# Patient Record
Sex: Male | Born: 1975 | Race: White | Hispanic: No | Marital: Single | Smoking: Former smoker
Health system: Southern US, Community
[De-identification: ages and names within clinical notes are randomized; demographics above are authoritative.]

## PROBLEM LIST (undated history)

## (undated) DIAGNOSIS — J189 Pneumonia, unspecified organism: Secondary | ICD-10-CM

## (undated) DIAGNOSIS — I639 Cerebral infarction, unspecified: Secondary | ICD-10-CM

---

## 2012-02-23 ENCOUNTER — Emergency Department (HOSPITAL_COMMUNITY): Payer: Self-pay

## 2012-02-23 ENCOUNTER — Emergency Department (HOSPITAL_COMMUNITY)
Admission: EM | Admit: 2012-02-23 | Discharge: 2012-02-23 | Disposition: A | Discharge status: Home / Self Care | Payer: Self-pay | Attending: Emergency Medicine | Admitting: Emergency Medicine

## 2012-02-23 ENCOUNTER — Encounter (HOSPITAL_COMMUNITY): Payer: Self-pay | Admitting: Emergency Medicine

## 2012-02-23 DIAGNOSIS — Z87891 Personal history of nicotine dependence: Secondary | ICD-10-CM | POA: Insufficient documentation

## 2012-02-23 DIAGNOSIS — Z8673 Personal history of transient ischemic attack (TIA), and cerebral infarction without residual deficits: Secondary | ICD-10-CM | POA: Insufficient documentation

## 2012-02-23 DIAGNOSIS — J209 Acute bronchitis, unspecified: Secondary | ICD-10-CM

## 2012-02-23 DIAGNOSIS — J40 Bronchitis, not specified as acute or chronic: Secondary | ICD-10-CM | POA: Insufficient documentation

## 2012-02-23 HISTORY — DX: Cerebral infarction, unspecified: I63.9

## 2012-02-23 HISTORY — DX: Pneumonia, unspecified organism: J18.9

## 2012-02-23 MED ORDER — PREDNISONE 20 MG PO TABS
60.0000 mg | ORAL_TABLET | Freq: Once | ORAL | Status: AC
  Administered 2012-02-23: 60 mg via ORAL
  Filled 2012-02-23: qty 3

## 2012-02-23 MED ORDER — ALBUTEROL SULFATE (5 MG/ML) 0.5% IN NEBU
5.0000 mg | INHALATION_SOLUTION | Freq: Once | RESPIRATORY_TRACT | Status: AC
  Administered 2012-02-23: 5 mg via RESPIRATORY_TRACT
  Filled 2012-02-23: qty 0.5

## 2012-02-23 MED ORDER — ALBUTEROL SULFATE (5 MG/ML) 0.5% IN NEBU
2.5000 mg | INHALATION_SOLUTION | Freq: Once | RESPIRATORY_TRACT | Status: AC
  Administered 2012-02-23: 2.5 mg via RESPIRATORY_TRACT
  Filled 2012-02-23: qty 0.5

## 2012-02-23 MED ORDER — PREDNISONE 50 MG PO TABS: 50.0000 mg | ORAL_TABLET | Freq: Every day | ORAL | Status: AC

## 2012-02-23 MED ORDER — ALBUTEROL SULFATE HFA 108 (90 BASE) MCG/ACT IN AERS: 1.0000 | INHALATION_SPRAY | Freq: Four times a day (QID) | RESPIRATORY_TRACT | Status: DC | PRN

## 2012-02-23 MED ORDER — IPRATROPIUM BROMIDE 0.02 % IN SOLN
0.5000 mg | Freq: Once | RESPIRATORY_TRACT | Status: AC
  Administered 2012-02-23: 0.5 mg via RESPIRATORY_TRACT
  Filled 2012-02-23: qty 2.5

## 2012-02-23 MED ORDER — ALBUTEROL SULFATE HFA 108 (90 BASE) MCG/ACT IN AERS
2.0000 | INHALATION_SPRAY | Freq: Four times a day (QID) | RESPIRATORY_TRACT | Status: DC | PRN
  Administered 2012-02-23: 2 via RESPIRATORY_TRACT
  Filled 2012-02-23: qty 6.7

## 2012-02-23 NOTE — Progress Notes (Signed)
Pt listed as self pay with no insurance coverage Pt confirms he is [place] resident.  CM and [facility] coordinator spoke with him Pt refused offered, looking for insurance options through employer -[facility] Tuesday

## 2012-02-23 NOTE — Discharge Instructions (Signed)
Acute Bronchitis You have acute bronchitis. This means you have a chest cold. The airways in your lungs are red and sore (inflamed). Acute means it is sudden onset.  CAUSES Bronchitis is most often caused by the same virus that causes a cold. SYMPTOMS   Body aches.   Chest congestion.   Chills.   Cough.   Fever.   Shortness of breath.   Sore throat.  TREATMENT  Acute bronchitis is usually treated with rest, fluids, and medicines for relief of fever or cough. Most symptoms should go away after a few days or a week. Increased fluids may help thin your secretions and will prevent dehydration. Your caregiver may give you an inhaler to improve your symptoms. The inhaler reduces shortness of breath and helps control cough. You can take over-the-counter pain relievers or cough medicine to decrease coughing, pain, or fever. A cool-air vaporizer may help thin bronchial secretions and make it easier to clear your chest. Antibiotics are usually not needed but can be prescribed if you smoke, are seriously ill, have chronic lung problems, are elderly, or you are at higher risk for developing complications.Allergies and asthma can make bronchitis worse. Repeated episodes of bronchitis may cause longstanding lung problems. Avoid smoking and secondhand smoke.Exposure to cigarette smoke or irritating chemicals will make bronchitis worse. If you are a cigarette smoker, consider using nicotine gum or skin patches to help control withdrawal symptoms. Quitting smoking will help your lungs heal faster. Recovery from bronchitis is often slow, but you should start feeling better after 2 to 3 days. Cough from bronchitis frequently lasts for 3 to 4 weeks. To prevent another bout of acute bronchitis:  Quit smoking.   Wash your hands frequently to get rid of viruses or use a hand sanitizer.   Avoid other people with cold or virus symptoms.   Try not to touch your hands to your mouth, nose, or eyes.  SEEK  IMMEDIATE MEDICAL CARE IF:  You develop increased fever, chills, or chest pain.   You have severe shortness of breath or bloody sputum.   You develop dehydration, fainting, repeated vomiting, or a severe headache.   You have no improvement after 1 week of treatment or you get worse.  MAKE SURE YOU:   Understand these instructions.   Will watch your condition.   Will get help right away if you are not doing well or get worse.  Document Released: 10/26/2004 Document Revised: 09/07/2011 Document Reviewed: 01/11/2011 [facility] Patient Information 2012 [place], [place]. RESOURCE GUIDE  Dental Problems  Patients with Medicaid: [facility]                     [phone] [address].                                           Phone:  [phone]                                                  If unable to pay or uninsured, contact:  [facility] or [facility]. to become qualified for the adult dental clinic.  Chronic Pain Problems Contact [facility]  [phone] Patients need to be referred by  their primary care doctor.  Insufficient Money for Medicine Contact [employer]:  call "211" or [facility] [phone].  No Primary Care Doctor Call [facility]  [phone] Other agencies that provide inexpensive medical care    [facility]  [phone]    [facility]  [phone]    [facility]  [phone]    [facility]  [phone]    [facility]  [phone]    [facility]  [phone]  Substance Abuse Resources [facility]  548-078-4559 [facility] [phone] [facility] [phone] Floydene Flock 4017729011 Residential & Outpatient Substance Abuse Program  336-541-4935  Psychological Services [facility]  [phone] [facility]  [phone] [facility]   [phone] (emergency services  973-267-6817)  Abuse/Neglect [facility] Child Abuse Hotline (534)102-8743 [facility] Child Abuse Hotline 207-595-8753 (After Hours)  Emergency Shelter [facility] 321-080-4900  Maternity Homes Room at the [place] [facility] 937-135-9253 [facility] (531) [phone]  MRSA Hotline #:   337-795-3170    [facility] Resources  [facility]  United [employer]                           [facility] [address] [place]                     [address]         [address]  Blondell Reveal Phone:  [phone]                                  Phone:  [phone]                   Phone:  [phone]  [facility] Phone:  ([phone])[phone]  [facility] Child Abuse Hotline 7190088652 (626)673-0900 (After Hours)Bronchospasm A bronchospasm is when the tubes that carry air in and out of your lungs (bronchioles) become smaller. It is hard to breathe when this happens. A bronchospasm can be caused by:  Asthma.   Allergies.   Lung infection.  HOME CARE   Do not  smoke. Avoid places that have secondhand smoke.   Dust your house often. Have your air ducts cleaned once or twice a year.   Find out what allergies may cause your bronchospasms.   Use your inhaler properly if you have one. Know when to use it.   Eat healthy foods and drink plenty of water.   Only take medicine as told by your doctor.  GET HELP RIGHT AWAY IF:  You feel you cannot breathe or catch your breath.   You cannot stop coughing.   Your treatment is not helping you breathe better.  MAKE SURE YOU:   Understand these instructions.   Will watch your condition.   Will get  help right away if you are not doing well or get worse.  Document Released: 07/16/2009 Document Revised: 09/07/2011 Document Reviewed:  07/16/2009 [facility] Patient Information 2012 [place], [place].

## 2012-02-23 NOTE — ED Provider Notes (Signed)
History    CSN: [id number] Arrival date & time 02/23/12  4098 First MD Initiated Contact with Patient 02/23/12 [phone]    Chief Complaint  Patient presents with  . Shortness of Breath    shortness of breath, waking from sleep    Patient is a 36 y.o. male presenting with shortness of breath. The history is provided by the patient.  Shortness of Breath  The current episode started more than 2 weeks ago. The onset was gradual. The problem has been gradually worsening. The problem is moderate. The symptoms are relieved by nothing. The symptoms are aggravated by activity. Associated symptoms include cough, shortness of breath and wheezing. Pertinent negatives include no chest pain, no chest pressure, no rhinorrhea and no sore throat. He has had no prior steroid use. His past medical history does not include asthma. There were no sick contacts.  Pt states he has history of pna in the past.  Pt denies history of asthma but he still smokes. Pt was given an albuterol treatment prior to my evaluation and has noticed that he is feeling a lot better.  Past Medical History  Diagnosis Date  . Stroke   . PNA (pneumonia)     pneumonia x1  . Stroke, embolic     loss of vision l/eye 3 years ago   patient reports history of drug abuse in the past  History reviewed. No pertinent past surgical history.  Family History  Problem Relation Age of Onset  . Hypertension Father     History  Substance Use Topics  . Smoking status: Current Everyday Smoker    Types: Cigarettes  . Smokeless tobacco: Not on file  . Alcohol Use: Yes      Review of Systems  HENT: Negative for sore throat and rhinorrhea.   Respiratory: Positive for cough, shortness of breath and wheezing.   Cardiovascular: Negative for chest pain.  All other systems reviewed and are negative.    Allergies  Review of patient's allergies indicates no known allergies.  Home Medications   Current Outpatient Rx  Name Route Sig Dispense  Refill  . DIPHENHYDRAMINE HCL 25 MG PO CAPS Oral Take 25 mg by mouth every 6 (six) hours as needed.      BP 151/97  Pulse 106  Temp(Src) 98.6 F (37 C) (Oral)  Resp 16  SpO2 96%  Physical Exam  Nursing note and vitals reviewed. Constitutional: He appears well-developed and well-nourished. No distress.  HENT:  Head: Normocephalic and atraumatic.  Right Ear: External ear normal.  Left Ear: External ear normal.  Eyes: Conjunctivae are normal. Right eye exhibits no discharge. Left eye exhibits no discharge. No scleral icterus.  Neck: Neck supple. No tracheal deviation present.  Cardiovascular: Normal rate, regular rhythm and intact distal pulses.   Pulmonary/Chest: Effort normal. No accessory muscle usage or stridor. No respiratory distress. He has no decreased breath sounds. He has wheezes. He has no rhonchi. He has no rales.  Abdominal: Soft. Bowel sounds are normal. He exhibits no distension. There is no tenderness. There is no rebound and no guarding.  Musculoskeletal: He exhibits no edema and no tenderness.  Neurological: He is alert. He has normal strength. No sensory deficit. Cranial nerve deficit:  no gross defecits noted. He exhibits normal muscle tone. He displays no seizure activity. Coordination normal.  Skin: Skin is warm and dry. No rash noted.  Psychiatric: He has a normal mood and affect.    ED Course  Procedures (including critical care time)  Labs Reviewed - No data to display Dg Chest 2 View  02/23/2012  *RADIOLOGY REPORT*  Clinical Data: Shortness of breath and chest pain.  CHEST - 2 VIEW  Comparison: None.  Findings: Two views of the chest were obtained.  There are slightly prominent lung markings but no focal disease.  No evidence for edema or airspace disease. Heart and mediastinum are within normal limits.  The trachea is midline.  Bony structures are intact.  IMPRESSION: No acute chest findings.  Original Report Authenticated By: Richarda Overlie, M.D.    1.  Bronchitis with bronchospasm    MDM  The patient improved with treatment in emergency department. I counseled the patient on smoking cessation. The patient was given albuterol Atrovent treatments here in the emergency department. He had good response with resolution of his wheezing. He was discharged home with a prescription for albuterol inhalers and steroids.        Celene Kras, MD 02/23/12 [phone]

## 2012-02-23 NOTE — ED Notes (Signed)
Pt reports recurrent shortness of breath and wheezing, intermittent x 2 weeks. Sx decreased post benadryl this am. Bilat. Breath sounds anterior wheezing, Decreased posterior is bases

## 2014-11-13 ENCOUNTER — Emergency Department (HOSPITAL_COMMUNITY): Payer: Self-pay

## 2014-11-13 ENCOUNTER — Encounter (HOSPITAL_COMMUNITY): Payer: Self-pay

## 2014-11-13 ENCOUNTER — Emergency Department (HOSPITAL_COMMUNITY)
Admission: EM | Admit: 2014-11-13 | Discharge: 2014-11-13 | Disposition: A | Discharge status: Home / Self Care | Payer: Self-pay | Attending: Emergency Medicine | Admitting: Emergency Medicine

## 2014-11-13 DIAGNOSIS — Z8701 Personal history of pneumonia (recurrent): Secondary | ICD-10-CM | POA: Insufficient documentation

## 2014-11-13 DIAGNOSIS — Z8673 Personal history of transient ischemic attack (TIA), and cerebral infarction without residual deficits: Secondary | ICD-10-CM | POA: Insufficient documentation

## 2014-11-13 DIAGNOSIS — Z7982 Long term (current) use of aspirin: Secondary | ICD-10-CM | POA: Insufficient documentation

## 2014-11-13 DIAGNOSIS — Z720 Tobacco use: Secondary | ICD-10-CM | POA: Insufficient documentation

## 2014-11-13 DIAGNOSIS — J40 Bronchitis, not specified as acute or chronic: Secondary | ICD-10-CM | POA: Insufficient documentation

## 2014-11-13 DIAGNOSIS — Z791 Long term (current) use of non-steroidal anti-inflammatories (NSAID): Secondary | ICD-10-CM | POA: Insufficient documentation

## 2014-11-13 LAB — CBC
HCT: 44.7 % (ref 39.0–52.0)
HEMOGLOBIN: 15.2 g/dL (ref 13.0–17.0)
MCH: 32.3 pg (ref 26.0–34.0)
MCHC: 34 g/dL (ref 30.0–36.0)
MCV: 95.1 fL (ref 78.0–100.0)
PLATELETS: 245 10*3/uL (ref 150–400)
RBC: 4.7 MIL/uL (ref 4.22–5.81)
RDW: 12.7 % (ref 11.5–15.5)
WBC: 13.3 10*3/uL — AB (ref 4.0–10.5)

## 2014-11-13 MED ORDER — PREDNISONE 20 MG PO TABS
60.0000 mg | ORAL_TABLET | Freq: Once | ORAL | Status: AC
Start: 2014-11-13 — End: 2014-11-13
  Administered 2014-11-13: 60 mg via ORAL
  Filled 2014-11-13: qty 3

## 2014-11-13 MED ORDER — PREDNISONE 50 MG PO TABS: 50.0000 mg | ORAL_TABLET | Freq: Every day | ORAL | Status: DC

## 2014-11-13 MED ORDER — IPRATROPIUM BROMIDE 0.02 % IN SOLN
0.5000 mg | Freq: Once | RESPIRATORY_TRACT | Status: AC
Start: 2014-11-13 — End: 2014-11-13
  Administered 2014-11-13: 0.5 mg via RESPIRATORY_TRACT
  Filled 2014-11-13: qty 2.5

## 2014-11-13 MED ORDER — SODIUM CHLORIDE 0.9 % IV BOLUS (SEPSIS)
1000.0000 mL | Freq: Once | INTRAVENOUS | Status: AC
  Administered 2014-11-13: 1000 mL via INTRAVENOUS

## 2014-11-13 MED ORDER — ALBUTEROL SULFATE (2.5 MG/3ML) 0.083% IN NEBU
5.0000 mg | INHALATION_SOLUTION | Freq: Once | RESPIRATORY_TRACT | Status: AC
  Administered 2014-11-13: 5 mg via RESPIRATORY_TRACT
  Filled 2014-11-13: qty 6

## 2014-11-13 MED ORDER — GUAIFENESIN ER 1200 MG PO TB12: 1.0000 | ORAL_TABLET | Freq: Two times a day (BID) | ORAL | Status: DC

## 2014-11-13 MED ORDER — AEROCHAMBER PLUS FLO-VU MEDIUM MISC
1.0000 | Freq: Once | Status: AC
  Administered 2014-11-13: 1
  Filled 2014-11-13: qty 1

## 2014-11-13 MED ORDER — ACETAMINOPHEN-CODEINE 120-12 MG/5ML PO SOLN: 10.0000 mL | ORAL | Status: DC | PRN

## 2014-11-13 MED ORDER — ALBUTEROL (5 MG/ML) CONTINUOUS INHALATION SOLN
10.0000 mg/h | INHALATION_SOLUTION | RESPIRATORY_TRACT | Status: AC
  Administered 2014-11-13: 10 mg/h via RESPIRATORY_TRACT
  Filled 2014-11-13: qty 20

## 2014-11-13 MED ORDER — ALBUTEROL SULFATE HFA 108 (90 BASE) MCG/ACT IN AERS
2.0000 | INHALATION_SPRAY | RESPIRATORY_TRACT | Status: DC | PRN
  Administered 2014-11-13: 2 via RESPIRATORY_TRACT
  Filled 2014-11-13: qty 6.7

## 2014-11-13 NOTE — Discharge Instructions (Signed)
Return here as needed.  Follow-up with the clinic provided.  Increase your fluid intake and rest as much as possible

## 2014-11-13 NOTE — ED Notes (Signed)
Patient reports a non-productive cough x 3 days. Patient also c/o dry heaves from frequent coughing. Patient has diminished and expiratory wheezing.

## 2014-11-13 NOTE — ED Provider Notes (Signed)
CSN: [id number]     Arrival date & time 11/13/14  1408 History   First MD Initiated Contact with Patient 11/13/14 1533     Chief Complaint  Patient presents with  . Shortness of Breath     (Consider location/radiation/quality/duration/timing/severity/associated sxs/prior Treatment) HPI  Patient presents complaining of SOB and cough x 3 days.  He denies fever, chills, night sweats, sore throat, weakness, dizziness, runny nose, sore throat, nausea, vomiting, diarrhea, abdominal pain, back pain,  and hemoptysis.  He notices CP with deep inspiration and muscle soreness from aggressively coughing.  He is a current smoker, about 0.5 ppd, with approximately 40 pack year history.  Denies a history of asthma.  Patient states that nothing seems to make his condition, better or worse  Past Medical History  Diagnosis Date  . Stroke   . PNA (pneumonia)     pneumonia x1  . Stroke, embolic     loss of vision l/eye 3 years ago   History reviewed. No pertinent past surgical history. Family History  Problem Relation Age of Onset  . Hypertension Father    History  Substance Use Topics  . Smoking status: Current Every Day Smoker -- 0.50 packs/day    Types: Cigarettes  . Smokeless tobacco: Never Used  . Alcohol Use: Yes     Comment: 40 ounce daily    Review of Systems  All other systems reviewed and are negative.  All other systems negative except as documented in the HPI. All pertinent positives and negatives as reviewed in the HPI.   Allergies  Review of patient's allergies indicates no known allergies.  Home Medications   Prior to Admission medications   Medication Sig Start Date End Date Taking? Authorizing Provider  aspirin EC 81 MG tablet Take 81 mg by mouth daily.   Yes Historical Provider, MD  naproxen sodium (ANAPROX) 220 MG tablet Take 440 mg by mouth daily.   Yes Historical Provider, MD  albuterol (PROVENTIL HFA;VENTOLIN HFA) 108 (90 BASE) MCG/ACT inhaler Inhale 1-2 puffs  into the lungs every 6 (six) hours as needed for wheezing. 02/23/12 02/22/13  Linwood DibblesJon Knapp, MD   BP 158/93 mmHg  Pulse 114  Temp(Src) 98.2 F (36.8 C) (Oral)  Resp 20  SpO2 94% Physical Exam  Constitutional: He is oriented to person, place, and time. He appears well-developed and well-nourished. No distress.  HENT:  Head: Normocephalic and atraumatic.  Mouth/Throat: Oropharynx is clear and moist.  Eyes: Conjunctivae are normal. Pupils are equal, round, and reactive to light.  Neck: Trachea normal and normal range of motion. Neck supple.  Cardiovascular: Normal rate, regular rhythm, S1 normal, S2 normal and normal heart sounds.  Exam reveals no gallop and no friction rub.   No murmur heard. Pulmonary/Chest: Effort normal. No accessory muscle usage. No tachypnea and no bradypnea. No respiratory distress. He has wheezes in the right upper field, the right middle field, the right lower field, the left upper field, the left middle field and the left lower field. He has rhonchi in the right lower field and the left lower field.  Abdominal: Normal appearance.  Musculoskeletal: He exhibits no edema.  Neurological: He is alert and oriented to person, place, and time.  Skin: Skin is warm, dry and intact. No rash noted. No cyanosis.  Psychiatric: He has a normal mood and affect.  Nursing note and vitals reviewed.   ED Course  Procedures (including critical care time) Labs Review Labs Reviewed  CBC - Abnormal; Notable for the  following:    WBC 13.3 (*)    All other components within normal limits    Imaging Review Dg Chest 2 View (if Patient Has Fever And/or Copd)  11/13/2014   CLINICAL DATA:  Shortness of breath and dry cough for 3 days.  EXAM: CHEST  2 VIEW  COMPARISON:  02/23/2012  FINDINGS: Grossly unchanged cardiac silhouette and mediastinal contours. Unchanged perihilar heterogeneous opacities favored to represent atelectasis or scar. No new focal airspace opacities. No pleural effusion or  pneumothorax. No evidence of edema. No acute osseus abnormalities.  IMPRESSION: No acute cardiopulmonary disease.   Electronically Signed   By: Simonne Come M.D.   On: 11/13/2014 16:10      MDM   Final diagnoses:  None    Patient presents with SOB and non-productive cough x 3 days.  During his ED course, he received an hour long nebulizer treatment.  CXR was negative.  Elevated WBC on CBC most likely due to viral upper respiratory infection.  Prescribed prednisone, albuterol, mucinex, and cough suppressant.  Patient tolerated treatment and deemed appropriate for discharge.  Patient may return as needed or if symptoms worsen.     Carlyle Dolly, PA-C 11/13/14 1814  Toy Baker, MD 11/14/14 [phone]

## 2014-11-22 ENCOUNTER — Encounter (HOSPITAL_COMMUNITY): Payer: Self-pay

## 2014-11-22 ENCOUNTER — Emergency Department (HOSPITAL_COMMUNITY): Payer: Self-pay

## 2014-11-22 ENCOUNTER — Emergency Department (HOSPITAL_COMMUNITY)
Admission: EM | Admit: 2014-11-22 | Discharge: 2014-11-22 | Disposition: A | Discharge status: Home / Self Care | Payer: Self-pay | Attending: Emergency Medicine | Admitting: Emergency Medicine

## 2014-11-22 DIAGNOSIS — Z8701 Personal history of pneumonia (recurrent): Secondary | ICD-10-CM | POA: Insufficient documentation

## 2014-11-22 DIAGNOSIS — Z720 Tobacco use: Secondary | ICD-10-CM | POA: Insufficient documentation

## 2014-11-22 DIAGNOSIS — Z7982 Long term (current) use of aspirin: Secondary | ICD-10-CM | POA: Insufficient documentation

## 2014-11-22 DIAGNOSIS — J209 Acute bronchitis, unspecified: Secondary | ICD-10-CM | POA: Insufficient documentation

## 2014-11-22 DIAGNOSIS — Z8673 Personal history of transient ischemic attack (TIA), and cerebral infarction without residual deficits: Secondary | ICD-10-CM | POA: Insufficient documentation

## 2014-11-22 MED ORDER — IPRATROPIUM-ALBUTEROL 0.5-2.5 (3) MG/3ML IN SOLN
3.0000 mL | Freq: Once | RESPIRATORY_TRACT | Status: AC
  Administered 2014-11-22: 3 mL via RESPIRATORY_TRACT
  Filled 2014-11-22: qty 3

## 2014-11-22 MED ORDER — AZITHROMYCIN 250 MG PO TABS: ORAL_TABLET | ORAL | Status: DC

## 2014-11-22 MED ORDER — PREDNISONE 20 MG PO TABS: 20.0000 mg | ORAL_TABLET | Freq: Two times a day (BID) | ORAL | Status: DC

## 2014-11-22 MED ORDER — ALBUTEROL SULFATE HFA 108 (90 BASE) MCG/ACT IN AERS: 1.0000 | INHALATION_SPRAY | Freq: Four times a day (QID) | RESPIRATORY_TRACT | Status: DC | PRN

## 2014-11-22 MED ORDER — AZITHROMYCIN 250 MG PO TABS
500.0000 mg | ORAL_TABLET | Freq: Once | ORAL | Status: AC
  Administered 2014-11-22: 500 mg via ORAL
  Filled 2014-11-22: qty 2

## 2014-11-22 NOTE — Discharge Instructions (Signed)
Acute Bronchitis Bronchitis is inflammation of the airways that extend from the windpipe into the lungs (bronchi). The inflammation often causes mucus to develop. This leads to a cough, which is the most common symptom of bronchitis.  In acute bronchitis, the condition usually develops suddenly and goes away over time, usually in a couple weeks. Smoking, allergies, and asthma can make bronchitis worse. Repeated episodes of bronchitis may cause further lung problems.  CAUSES Acute bronchitis is most often caused by the same virus that causes a cold. The virus can spread from person to person (contagious) through coughing, sneezing, and touching contaminated objects. SIGNS AND SYMPTOMS   Cough.   Fever.   Coughing up mucus.   Body aches.   Chest congestion.   Chills.   Shortness of breath.   Sore throat.  DIAGNOSIS  Acute bronchitis is usually diagnosed through a physical exam. Your health care provider will also ask you questions about your medical history. Tests, such as chest X-rays, are sometimes done to rule out other conditions.  TREATMENT  Acute bronchitis usually goes away in a couple weeks. Oftentimes, no medical treatment is necessary. Medicines are sometimes given for relief of fever or cough. Antibiotic medicines are usually not needed but may be prescribed in certain situations. In some cases, an inhaler may be recommended to help reduce shortness of breath and control the cough. A cool mist vaporizer may also be used to help thin bronchial secretions and make it easier to clear the chest.  HOME CARE INSTRUCTIONS  Get plenty of rest.   Drink enough fluids to keep your urine clear or pale yellow (unless you have a medical condition that requires fluid restriction). Increasing fluids may help thin your respiratory secretions (sputum) and reduce chest congestion, and it will prevent dehydration.   Take medicines only as directed by your health care provider.  If  you were prescribed an antibiotic medicine, finish it all even if you start to feel better.  Avoid smoking and secondhand smoke. Exposure to cigarette smoke or irritating chemicals will make bronchitis worse. If you are a smoker, consider using nicotine gum or skin patches to help control withdrawal symptoms. Quitting smoking will help your lungs heal faster.   Reduce the chances of another bout of acute bronchitis by washing your hands frequently, avoiding people with cold symptoms, and trying not to touch your hands to your mouth, nose, or eyes.   Keep all follow-up visits as directed by your health care provider.  SEEK MEDICAL CARE IF: Your symptoms do not improve after 1 week of treatment.  SEEK IMMEDIATE MEDICAL CARE IF:  You develop an increased fever or chills.   You have chest pain.   You have severe shortness of breath.  You have bloody sputum.   You develop dehydration.  You faint or repeatedly feel like you are going to pass out.  You develop repeated vomiting.  You develop a severe headache. MAKE SURE YOU:   Understand these instructions.  Will watch your condition.  Will get help right away if you are not doing well or get worse. Document Released: 10/26/2004 Document Revised: 02/02/2014 Document Reviewed: 03/11/2013 Exit[employer] Patient Information 2015 [employer]. This information is not intended to replace advice given to you by your health care provider. Make sure you discuss any questions you have with your health care provider. Smoking Cessation Quitting smoking is important to your health and has many advantages. However, it is not always easy to quit since nicotine is   a very addictive drug. Oftentimes, people try 3 times or more before being able to quit. This document explains the best ways for you to prepare to quit smoking. Quitting takes hard work and a lot of effort, but you can do it. ADVANTAGES OF QUITTING SMOKING  You will live longer, feel  better, and live better.  Your body will feel the impact of quitting smoking almost immediately.  Within 20 minutes, blood pressure decreases. Your pulse returns to its normal level.  After 8 hours, carbon monoxide levels in the blood return to normal. Your oxygen level increases.  After 24 hours, the chance of having a heart attack starts to decrease. Your breath, hair, and body stop smelling like smoke.  After 48 hours, damaged nerve endings begin to recover. Your sense of taste and smell improve.  After 72 hours, the body is virtually free of nicotine. Your bronchial tubes relax and breathing becomes easier.  After 2 to 12 weeks, lungs can hold more air. Exercise becomes easier and circulation improves.  The risk of having a heart attack, stroke, cancer, or lung disease is greatly reduced.  After 1 year, the risk of coronary heart disease is cut in half.  After 5 years, the risk of stroke falls to the same as a nonsmoker.  After 10 years, the risk of lung cancer is cut in half and the risk of other cancers decreases significantly.  After 15 years, the risk of coronary heart disease drops, usually to the level of a nonsmoker.  If you are pregnant, quitting smoking will improve your chances of having a healthy baby.  The people you live with, especially any children, will be healthier.  You will have extra money to spend on things other than cigarettes. QUESTIONS TO THINK ABOUT BEFORE ATTEMPTING TO QUIT You may want to talk about your answers with your health care provider.  Why do you want to quit?  If you tried to quit in the past, what helped and what did not?  What will be the most difficult situations for you after you quit? How will you plan to handle them?  Who can help you through the tough times? Your family? Friends? A health care provider?  What pleasures do you get from smoking? What ways can you still get pleasure if you quit? Here are some questions to ask  your health care provider:  How can you help me to be successful at quitting?  What medicine do you think would be best for me and how should I take it?  What should I do if I need more help?  What is smoking withdrawal like? How can I get information on withdrawal? GET READY  Set a quit date.  Change your environment by getting rid of all cigarettes, ashtrays, matches, and lighters in your home, car, or work. Do not let people smoke in your home.  Review your past attempts to quit. Think about what worked and what did not. GET SUPPORT AND ENCOURAGEMENT You have a better chance of being successful if you have help. You can get support in many ways.  Tell your family, friends, and coworkers that you are going to quit and need their support. Ask them not to smoke around you.  Get individual, group, or telephone counseling and support. Programs are available at local hospitals and health centers. Call your local health department for information about programs in your area.  Spiritual beliefs and practices may help some smokers quit.  Download   a "quit meter" on your computer to keep track of quit statistics, such as how long you have gone without smoking, cigarettes not smoked, and money saved.  Get a self-help book about quitting smoking and staying off tobacco. LEARN NEW SKILLS AND BEHAVIORS  Distract yourself from urges to smoke. Talk to someone, go for a walk, or occupy your time with a task.  Change your normal routine. Take a different route to work. Drink tea instead of coffee. Eat breakfast in a different place.  Reduce your stress. Take a hot bath, exercise, or read a book.  Plan something enjoyable to do every day. Reward yourself for not smoking.  Explore interactive web-based programs that specialize in helping you quit. GET MEDICINE AND USE IT CORRECTLY Medicines can help you stop smoking and decrease the urge to smoke. Combining medicine with the above behavioral  methods and support can greatly increase your chances of successfully quitting smoking.  Nicotine replacement therapy helps deliver nicotine to your body without the negative effects and risks of smoking. Nicotine replacement therapy includes nicotine gum, lozenges, inhalers, nasal sprays, and skin patches. Some may be available over-the-counter and others require a prescription.  Antidepressant medicine helps people abstain from smoking, but how this works is unknown. This medicine is available by prescription.  Nicotinic receptor partial agonist medicine simulates the effect of nicotine in your brain. This medicine is available by prescription. Ask your health care provider for advice about which medicines to use and how to use them based on your health history. Your health care provider will tell you what side effects to look out for if you choose to be on a medicine or therapy. Carefully read the information on the package. Do not use any other product containing nicotine while using a nicotine replacement product.  RELAPSE OR DIFFICULT SITUATIONS Most relapses occur within the first 3 months after quitting. Do not be discouraged if you start smoking again. Remember, most people try several times before finally quitting. You may have symptoms of withdrawal because your body is used to nicotine. You may crave cigarettes, be irritable, feel very hungry, cough often, get headaches, or have difficulty concentrating. The withdrawal symptoms are only temporary. They are strongest when you first quit, but they will go away within 10-14 days. To reduce the chances of relapse, try to:  Avoid drinking alcohol. Drinking lowers your chances of successfully quitting.  Reduce the amount of caffeine you consume. Once you quit smoking, the amount of caffeine in your body increases and can give you symptoms, such as a rapid heartbeat, sweating, and anxiety.  Avoid smokers because they can make you want to  smoke.  Do not let weight gain distract you. Many smokers will gain weight when they quit, usually less than 10 pounds. Eat a healthy diet and stay active. You can always lose the weight gained after you quit.  Find ways to improve your mood other than smoking. FOR MORE INFORMATION  www.smokefree.gov  Document Released: 09/12/2001 Document Revised: 02/02/2014 Document Reviewed: 12/28/2011 [employer] Patient Information 2015 [employer]. This information is not intended to replace advice given to you by your health care provider. Make sure you discuss any questions you have with your health care provider.  

## 2014-11-22 NOTE — ED Notes (Signed)
Pt transported to XRAY °

## 2014-11-22 NOTE — ED Notes (Signed)
Per pt, since approx over a week ago, pt has had shortness of breath with a cough. Seen here on on 2/12 and dx with bronchitis.  Steroids and inhaler.  Breathing not improving.  Pt appears short of breath and tight on assessment.  Productive cough.  No fever.

## 2014-11-22 NOTE — ED Notes (Signed)
Pt alert, oriented, and ambulatory upon DC.  He was advised to follow up with a PCP if not better and number provided to assist with finding PCP. Pt was also give smoking cessation information.

## 2014-11-22 NOTE — ED Notes (Signed)
MD Wentz at bedside.  

## 2014-11-22 NOTE — ED Provider Notes (Signed)
CSN: [id number]     Arrival date & time 11/22/14  4098 History   First MD Initiated Contact with Patient 11/22/14 0740     Chief Complaint  Patient presents with  . Cough  . Shortness of Breath     (Consider location/radiation/quality/duration/timing/severity/associated sxs/prior Treatment) HPI  Arel Tippen is a 39 y.o. male who presents for evaluation of persistent cough productive of green white sputum.  He is using inhaler was prescribed 2 weeks ago, for an episode bronchitis.  At the time.  He is also treated symptomatically with Mucinex, and cough medicine.  He continues to smoke cigarettes.  He denies chronic history of asthma.  Has been no fever, chest pain, weakness, dizziness, nausea or vomiting.  He works as a Comptroller.  There are no other known modifying factors.   Past Medical History  Diagnosis Date  . Stroke   . PNA (pneumonia)     pneumonia x1  . Stroke, embolic     loss of vision l/eye 3 years ago   History reviewed. No pertinent past surgical history. Family History  Problem Relation Age of Onset  . Hypertension Father    History  Substance Use Topics  . Smoking status: Current Every Day Smoker -- 0.50 packs/day    Types: Cigarettes  . Smokeless tobacco: Never Used  . Alcohol Use: Yes     Comment: 40 ounce daily    Review of Systems  All other systems reviewed and are negative.     Allergies  Review of patient's allergies indicates no known allergies.  Home Medications   Prior to Admission medications   Medication Sig Start Date End Date Taking? Authorizing Provider  aspirin EC 81 MG tablet Take 81 mg by mouth daily.   Yes Historical Provider, MD  albuterol (PROVENTIL HFA;VENTOLIN HFA) 108 (90 BASE) MCG/ACT inhaler Inhale 1-2 puffs into the lungs every 6 (six) hours as needed for wheezing or shortness of breath. 11/22/14   Flint Melter, MD  azithromycin (ZITHROMAX Z-PAK) 250 MG tablet 2 po day one, then 1 daily x 4 days 11/22/14    Flint Melter, MD  predniSONE (DELTASONE) 20 MG tablet Take 1 tablet (20 mg total) by mouth 2 (two) times daily. 11/22/14   Flint Melter, MD   BP 123/96 mmHg  Pulse 107  Temp(Src) 98.1 F (36.7 C) (Oral)  Resp 18  SpO2 93% Physical Exam  Constitutional: He is oriented to person, place, and time. He appears well-developed and well-nourished.  HENT:  Head: Normocephalic and atraumatic.  Right Ear: External ear normal.  Left Ear: External ear normal.  Eyes: Conjunctivae and EOM are normal. Pupils are equal, round, and reactive to light.  Neck: Normal range of motion and phonation normal. Neck supple.  Cardiovascular: Normal rate, regular rhythm and normal heart sounds.   Pulmonary/Chest: Effort normal. No respiratory distress. He exhibits no bony tenderness.  Decreased are mobile bilaterally with scattered rhonchi and wheezes.  Abdominal: Soft. There is no tenderness.  Musculoskeletal: Normal range of motion.  Neurological: He is alert and oriented to person, place, and time. No cranial nerve deficit or sensory deficit. He exhibits normal muscle tone. Coordination normal.  Skin: Skin is warm, dry and intact.  Psychiatric: He has a normal mood and affect. His behavior is normal. Judgment and thought content normal.  Nursing note and vitals reviewed.   ED Course  Procedures (including critical care time) Medications  ipratropium-albuterol (DUONEB) 0.5-2.5 (3) MG/3ML nebulizer solution 3 mL (  3 mLs Nebulization Given 11/22/14 0748)  azithromycin (ZITHROMAX) tablet 500 mg (500 mg Oral Given 11/22/14 0809)    Patient Vitals for the past 24 hrs:  BP Temp Temp src Pulse Resp SpO2  11/22/14 0821 123/96 mmHg - - 107 18 93 %  11/22/14 0810 - - - 110 12 94 %  11/22/14 0803 - - - 120 20 93 %  11/22/14 0742 143/72 mmHg 98.1 F (36.7 C) Oral 115 22 95 %      Labs Review Labs Reviewed - No data to display  Imaging Review Dg Chest 2 View  11/22/2014   CLINICAL DATA:  Acute cough,  congestion, and wheezing.  EXAM: CHEST  2 VIEW  COMPARISON:  11/13/2014  FINDINGS: Mild hyperinflation and central bronchitic changes, appearing chronic. No superimposed pneumonia, collapse or consolidation. No edema, effusion, or pneumothorax. Trachea midline. Normal heart size and vascularity. No acute osseous finding.  IMPRESSION: Chronic bronchitic change and mild hyperinflation.  Stable exam.   Electronically Signed   By: Judie PetitM.  Shick M.D.   On: 11/22/2014 08:15     EKG Interpretation None      MDM   Final diagnoses:  Acute bronchitis, unspecified organism  Tobacco abuse    Persistent symptoms of bronchitis with tobacco abuse.  Patient did not receive an antibiotic previously, and is persistent, productive sputum.  He may improve with antibiotics, but needs additional treatment for inflammatory process of bronchitis.  He has a long history of tobacco abuse.  Nursing Notes Reviewed/ Care Coordinated Applicable Imaging Reviewed Interpretation of Laboratory Data incorporated into ED treatment  The patient appears reasonably screened and/or stabilized for discharge and I doubt any other medical condition or other [facility] requiring further screening, evaluation, or treatment in the ED at this time prior to discharge.  Plan: Home Medications- Prednisone, Zithromax, Albuterol; Home Treatments- stop smoking; return here if the recommended treatment, does not improve the symptoms; Recommended follow up- PCP 1-2 weeks     Flint MelterElliott L Tarus Briski, MD 11/22/14 1525

## 2015-01-29 ENCOUNTER — Encounter (HOSPITAL_COMMUNITY): Payer: Self-pay | Admitting: Emergency Medicine

## 2015-01-29 ENCOUNTER — Emergency Department (HOSPITAL_COMMUNITY): Payer: BLUE CROSS/BLUE SHIELD

## 2015-01-29 ENCOUNTER — Emergency Department (HOSPITAL_COMMUNITY)
Admission: EM | Admit: 2015-01-29 | Discharge: 2015-01-29 | Disposition: A | Discharge status: Home / Self Care | Payer: BLUE CROSS/BLUE SHIELD | Attending: Emergency Medicine | Admitting: Emergency Medicine

## 2015-01-29 DIAGNOSIS — Z720 Tobacco use: Secondary | ICD-10-CM | POA: Diagnosis not present

## 2015-01-29 DIAGNOSIS — Z7982 Long term (current) use of aspirin: Secondary | ICD-10-CM | POA: Diagnosis not present

## 2015-01-29 DIAGNOSIS — Z8673 Personal history of transient ischemic attack (TIA), and cerebral infarction without residual deficits: Secondary | ICD-10-CM | POA: Diagnosis not present

## 2015-01-29 DIAGNOSIS — R062 Wheezing: Secondary | ICD-10-CM

## 2015-01-29 DIAGNOSIS — Z8701 Personal history of pneumonia (recurrent): Secondary | ICD-10-CM | POA: Insufficient documentation

## 2015-01-29 DIAGNOSIS — R0602 Shortness of breath: Secondary | ICD-10-CM | POA: Diagnosis present

## 2015-01-29 DIAGNOSIS — J209 Acute bronchitis, unspecified: Secondary | ICD-10-CM

## 2015-01-29 DIAGNOSIS — J40 Bronchitis, not specified as acute or chronic: Secondary | ICD-10-CM | POA: Diagnosis not present

## 2015-01-29 DIAGNOSIS — R0789 Other chest pain: Secondary | ICD-10-CM | POA: Insufficient documentation

## 2015-01-29 DIAGNOSIS — Z79899 Other long term (current) drug therapy: Secondary | ICD-10-CM | POA: Insufficient documentation

## 2015-01-29 DIAGNOSIS — J9801 Acute bronchospasm: Secondary | ICD-10-CM | POA: Insufficient documentation

## 2015-01-29 DIAGNOSIS — R5383 Other fatigue: Secondary | ICD-10-CM | POA: Insufficient documentation

## 2015-01-29 MED ORDER — AEROCHAMBER PLUS W/MASK MISC
1.0000 | Freq: Once | Status: DC
  Filled 2015-01-29: qty 1

## 2015-01-29 MED ORDER — IPRATROPIUM BROMIDE 0.02 % IN SOLN
0.5000 mg | Freq: Once | RESPIRATORY_TRACT | Status: AC
  Administered 2015-01-29: 0.5 mg via RESPIRATORY_TRACT
  Filled 2015-01-29: qty 2.5

## 2015-01-29 MED ORDER — IPRATROPIUM-ALBUTEROL 0.5-2.5 (3) MG/3ML IN SOLN
3.0000 mL | Freq: Once | RESPIRATORY_TRACT | Status: DC
Start: 2015-01-29 — End: 2015-01-29

## 2015-01-29 MED ORDER — CETIRIZINE HCL 10 MG PO TABS: 10.0000 mg | ORAL_TABLET | Freq: Every day | ORAL | Status: DC

## 2015-01-29 MED ORDER — ALBUTEROL SULFATE HFA 108 (90 BASE) MCG/ACT IN AERS
2.0000 | INHALATION_SPRAY | RESPIRATORY_TRACT | Status: DC | PRN
  Administered 2015-01-29: 2 via RESPIRATORY_TRACT
  Filled 2015-01-29: qty 6.7

## 2015-01-29 MED ORDER — PREDNISONE 20 MG PO TABS
60.0000 mg | ORAL_TABLET | Freq: Once | ORAL | Status: AC
  Administered 2015-01-29: 60 mg via ORAL
  Filled 2015-01-29: qty 3

## 2015-01-29 MED ORDER — ALBUTEROL SULFATE HFA 108 (90 BASE) MCG/ACT IN AERS: 2.0000 | INHALATION_SPRAY | RESPIRATORY_TRACT | Status: AC | PRN

## 2015-01-29 MED ORDER — PREDNISONE 10 MG PO TABS: 40.0000 mg | ORAL_TABLET | Freq: Every day | ORAL | Status: DC

## 2015-01-29 MED ORDER — ALBUTEROL SULFATE (2.5 MG/3ML) 0.083% IN NEBU
5.0000 mg | INHALATION_SOLUTION | Freq: Once | RESPIRATORY_TRACT | Status: AC
Start: 2015-01-29 — End: 2015-01-29
  Administered 2015-01-29: 5 mg via RESPIRATORY_TRACT
  Filled 2015-01-29: qty 6

## 2015-01-29 NOTE — ED Notes (Signed)
Pt reports hx of bronchitis.  Reports SOB which started yesterday, used his inhaler from when he had bronchitis last night with relief.  Today, he was at work on the grill, SOB became severe again.  Auditory expiratory wheezing noted.  Pt able to complete a sentence without pausing.  Pt is A&ox 4.  In NAD.

## 2015-01-29 NOTE — Discharge Instructions (Signed)
1. Medications: Albuterol, prednisone, Zyrtec, usual home medications 2. Treatment: rest, drink plenty of fluids, take tylenol or ibuprofen for fever control 3. Follow Up: Please followup with your primary doctor in 3 days for discussion of your diagnoses and further evaluation after today's visit; if you do not have a primary care doctor use the resource guide provided to find one; Return to the ER for high fevers, difficulty breathing or other concerning symptoms   Bronchospasm A bronchospasm is when the tubes that carry air in and out of your lungs (airways) spasm or tighten. During a bronchospasm it is hard to breathe. This is because the airways get smaller. A bronchospasm can be triggered by:  Allergies. These may be to animals, pollen, food, or mold.  Infection. This is a common cause of bronchospasm.  Exercise.  Irritants. These include pollution, cigarette smoke, strong odors, aerosol sprays, and paint fumes.  Weather changes.  Stress.  Being emotional. HOME CARE   Always have a plan for getting help. Know when to call your doctor and local emergency services (911 in the U.[place] Know where you can get emergency care.  Only take medicines as told by your doctor.  If you were prescribed an inhaler or nebulizer machine, ask your doctor how to use it correctly. Always use a spacer with your inhaler if you were given one.  Stay calm during an attack. Try to relax and breathe more slowly.  Control your home environment:  Change your heating and air conditioning filter at least once a month.  Limit your use of fireplaces and wood stoves.  Do not  smoke. Do not  allow smoking in your home.  Avoid perfumes and fragrances.  Get rid of pests (such as roaches and mice) and their droppings.  Throw away plants if you see mold on them.  Keep your house clean and dust free.  Replace carpet with wood, tile, or vinyl flooring. Carpet can trap dander and dust.  Use allergy-proof  pillows, mattress covers, and box spring covers.  Wash bed sheets and blankets every week in hot water. Dry them in a dryer.  Use blankets that are made of polyester or cotton.  Wash hands frequently. GET HELP IF:  You have muscle aches.  You have chest pain.  The thick spit you spit or cough up (sputum) changes from clear or white to yellow, green, gray, or bloody.  The thick spit you spit or cough up gets thicker.  There are problems that may be related to the medicine you are given such as:  A rash.  Itching.  Swelling.  Trouble breathing. GET HELP RIGHT AWAY IF:  You feel you cannot breathe or catch your breath.  You cannot stop coughing.  Your treatment is not helping you breathe better.  You have very bad chest pain. MAKE SURE YOU:   Understand these instructions.  Will watch your condition.  Will get help right away if you are not doing well or get worse. Document Released: 07/16/2009 Document Revised: 09/23/2013 Document Reviewed: 03/11/2013 [facility] Patient Information 2015 [place], [place]. This information is not intended to replace advice given to you by your health care provider. Make sure you discuss any questions you have with your health care provider.    Emergency Department Resource Guide 1) Find a Doctor and Pay Out of Pocket Although you won't have to find out who is covered by your insurance plan, it is a good idea to ask around and get recommendations. You will  then need to call the office and see if the doctor you have chosen will accept you as a new patient and what types of options they offer for patients who are self-pay. Some doctors offer discounts or will set up payment plans for their patients who do not have insurance, but you will need to ask so you aren't surprised when you get to your appointment.  2) Contact Your Local Health Department Not all health departments have doctors that can see patients for sick visits, but many do, so  it is worth a call to see if yours does. If you don't know where your local health department is, you can check in your phone book. The CDC also has a tool to help you locate your state's health department, and many state websites also have listings of all of their local health departments.  3) Find a Walk-in Clinic If your illness is not likely to be very severe or complicated, you may want to try a walk in clinic. These are popping up all over the country in pharmacies, drugstores, and shopping centers. They're usually staffed by nurse practitioners or physician assistants that have been trained to treat common illnesses and complaints. They're usually fairly quick and inexpensive. However, if you have serious medical issues or chronic medical problems, these are probably not your best option.  No Primary Care Doctor: - Call Health Connect at  [phone] - they can help you locate a primary care doctor that  accepts your insurance, provides certain services, etc. - Physician Referral Service- [phone]  Chronic Pain Problems: Organization         Address  Phone   Notes  [facility]  [phone] Patients need to be referred by their primary care doctor.   Medication Assistance: Organization         Address  Phone   Notes  [facility] Medication [facility] [address] [place] [place], [place] [address] [phone] --Must be a resident of [facility] -- Must have NO insurance coverage whatsoever (no Medicaid/ Medicare, etc.) -- The pt. MUST have a primary care doctor that directs their care regularly and follows them in the community   [facility]ist  [phone]   [employer]  [phone]    Agencies that provide inexpensive medical care: Organization         Address  Phone   Notes  [facility]  [phone]   [facility]    [phone]   [facility] [address] [place], [place] [address] [phone]   [facility] [address] [place], [place] [phone]   Planned Parenthood    [phone]   [facility]    [phone]   [facility]  [address], Box Phone:  [phone], Fax:  [phone] Hours of Operation:  9 am - 6 pm, M-F.  Also accepts Medicaid/Medicare and self-pay.  [facility]  [address], [place] Phone: [phone], Fax: [phone]. Hours of Operation:  8:30 am - 5:30 pm, M-F.  Also accepts Medicaid and self-pay.  [facility] [place] [address], [place] Phone: [phone]   [facility] [address] Natasha Bence [place], [place] [phone], Ext. [phone] Mondays & Thursdays: 7-9 AM.  First 15 patients are seen on a first come, first serve  basis.    Medicaid-accepting [facility] Providers:  Organization         Address  Phone   Notes  [facility] [address], [place] [phone] Also accepts self-pay patients.  [facility] [address], [place]  [phone]) [phone]   [facility] [address], [place] [phone](336) [phone]   [facility] [address] [place] [address], [place] [phone]) [phone]   Renaye RakersVeita Bland [address], [place]   [phone]) [phone] Only accepts [place] Access [place] patients after they have their name applied to their card.   Self-Pay (no insurance) in [facility]:  Organization         Address  Phone   Notes  Sickle Cell Patients, [facility] [address], [place] [phone]) [phone]   [facility] [address], [place] [phone]) [phone]   [facility]  [address], [place] [phone]) [phone]   [facility]/Dr. Osei-Bonsu  [address] [place] [address], [place] or  [phone], [place] [phone]) [phone] Phone number for both [place] and [place] locations is the same.  [facility] [address], [place] [phone]) [phone]   [facility] [place] [address] [place] [address], [place] or [address] [phone]) [phone]   [facility] [address], [place] [phone]) [phone], phone; [phone](336) [phone], fax Sees patients 1st and 3rd Saturday of every month.  Must not qualify for public or private insurance (i.e. Medicaid, Medicare, [place] Health Choice, Veterans' Benefits)  Household income should be no more than 200% of the poverty level The clinic cannot treat you if you are pregnant or think you are pregnant  Sexually transmitted diseases are not treated at the clinic.    Dental Care: Organization         Address  Phone  Notes  [facility] of [facility] [address], [place] [phone]) [phone] Accepts children up to age 38 who are enrolled in [place] or [place] Health Choice; pregnant women with a Medicaid card; and children who have applied for Medicaid or [place] Health Choice, but were declined, whose parents can pay a reduced fee at time of service.  [facility] of [facility] [place]  [address], [place] [phone]) [phone] Accepts children up to age 38 who are enrolled in [place] or [place] Health Choice; pregnant women with a Medicaid card; and children who have applied for Medicaid or [place] Health Choice, but were declined, whose parents can pay a reduced fee at time of service.  [facility]  [address], [place] [phone]) [phone] Patients are seen by appointment only. Walk-ins are not accepted. [facility] will see patients 38 years of age and older. Monday - Tuesday (8am-5pm) Most Wednesdays (8:30-5pm) $30 per visit, cash only  [facility]  [address], [facility]  [place] [phone]) [phone] Patients are seen by appointment only. Walk-ins are not accepted. [facility] will see patients 38 years of age and older. One Wednesday Evening (Monthly: Volunteer Based).  $30 per visit, cash only  [employer] [facility]  [phone] for adults; Children under age 714, call Graduate Pediatric Dentistry at [phone]. Children aged 724-14, please call [phone] to request a pediatric application.  Dental services are provided in all areas of dental care including fillings, crowns and bridges, complete and partial dentures, implants, gum treatment, root canals, and extractions. Preventive care is also provided. Treatment is provided to both adults and children. Patients are selected via a lottery and there is often a waiting list.   [facility] [address], [place]  [phone] [email or web address]   [facility] [address] [place], [place] [phone], Ext. [phone] Second and Fourth Thursday of each month, opens at 6:30 AM; Clinic ends at 9 AM.  Patients are seen on a first-come first-served basis, and a limited number are seen during each clinic.   [facility]  [address] Ether Griffins [place], [place] [phone]   Eligibility Requirements You must have lived in [place], [place], or [place] counties for at least the last three months.   You cannot be eligible for state or federal sponsored [place], including [employer], [place], or [employer].   You generally cannot be eligible for healthcare insurance through your employer.    How to apply: Eligibility screenings are held every Tuesday and Wednesday afternoon from 1:00 pm until 4:00 pm. You do not need an appointment for the interview!  [facility] [address] [place], [place] [phone]   [facility] Health Department  [phone]   [facility] Health Department  [phone]   [facility]  Health Department  [phone]    Behavioral Health Resources in the Community: Intensive Outpatient Programs Organization         Address  Phone  Notes  [facility] [address] [place], [place] [phone]   [facility] Outpatient [address] [place], [place] [phone]   [facility] [address] [place], [place]  [phone]   [facility] Mental Health [address],  [place], [place] [phone] or [phone]   Substance Abuse Resources Organization         Address  Phone  Notes  [facility]  [phone]   [facility]  [phone]   [facility] [place]  [phone]   Floydene Flock  [phone]   Residential & Outpatient Substance Abuse Program  [phone]   Psychological Services Organization         Address  Phone  Notes  [facility] Health  [phone]   [facility]  [phone]   [facility] [address] [place] [phone] or [phone]    Mobile Crisis Teams Organization         Address  Phone  Notes  [facility], Mobile Crisis Care Unit  [phone]   [facility]  [address] [place], [place] [phone]   Doristine Locks [address] [place] [phone]    Self-Help/Support Groups Organization         Address  Phone             Notes  [facility] [place] - variety of support groups  336- I7437963 Call for more information  Narcotics Anonymous (NA), [facility] [address], [employer] [place]  2 meetings at this location   Statistician         Address  Phone  Notes  [facility] [address],    [place]  [phone]   [facility]  [address] [place] [address], [place], [place] [phone]   [facility] [address] [place], [place] [phone]  Admissions: 8am-3pm M-F  [facility] [address],    [place], [place] [phone]   [facility] [address] [place], [place], [place] [phone]   [facility] [address],  [place], [place] [phone]   [facility] - Intensive Outpatient [address], [place], [place] [phone]   [facility] [address] [place]  [place], [place] [phone] or [phone]   [facility] ([facility]) [address] [place], [place] [phone] Accepts Medicaid  [facility] [address],  [place] [phone] Substance Abuse/Addiction Treatment   [facility] Organization         Address  Phone  Notes  [facility]  [phone]   Angie Fava, PhD [address] Ervin Knack [place], [place]   [phone] or [phone]   [facility] Behavioral   [address] [place], [place] [phone]   [facility] [address], [place], [place] [phone] Insurance/Medicaid/sponsorship through [facility] [address]                                    [place], [place] [phone] Therapy/tele-psych/case  [facility] [address], [place] [phone]    Dr. Lolly Mustache  [phone]   [facility] [place]  [employer] [facility] Dept. 1) [address] [place] 2) [address] [place] 3)  [address], [place] [phone]  [phone]   [facility] Child Abuse Hotline 717-052-7956 or [phone] (After Hours)

## 2015-01-29 NOTE — ED Provider Notes (Signed)
CSN: [id number]     Arrival date & time 01/29/15  1512 History   First MD Initiated Contact with Patient 01/29/15 1721     Chief Complaint  Patient presents with  . Shortness of Breath  . Cough     (Consider location/radiation/quality/duration/timing/severity/associated sxs/prior Treatment) The history is provided by the patient and medical records. No language interpreter was used.     Sean Thompson is a 39 y.o. male  with a hx of CVA presents to the Emergency Department complaining of gradual, persistent, progressively worsening SOB onset 2 days ago.  Pt reports he has had URI symptoms for several weeks with intermittent wheezing.  He was given steroids and inhaler with significant  improvement and eventual resolution of symptoms for 2+ weeks but the symptoms returned 2 weeks ago.  Pt reports associated wheezing, cough, CP with cough only and sweating.  Pt reports the inhaler was helping a lot, but he ran out of it this morning.  Heat and exertion makes it worse.  Pt denies fever, chills, headache, neck pain, abd pain, N/V/D, weakness, dizziness, syncope.  Pt denies travel, swelling in the legs, recent surgery or broken bones, DVT.  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     Past Medical History  Diagnosis Date  . Stroke   . PNA (pneumonia)     pneumonia x1  . Stroke, embolic     loss of vision l/eye 3 years ago   History reviewed. No pertinent past surgical history. Family History  Problem Relation Age of Onset  . Hypertension Father    History  Substance Use Topics  . Smoking status: Current Every Day Smoker -- 0.50 packs/day    Types: Cigarettes  . Smokeless tobacco: Never Used  . Alcohol Use: Yes     Comment: 40 ounce daily    Review of Systems  Constitutional: Positive for fatigue. Negative for fever, chills and appetite change.  HENT: Positive for congestion, postnasal drip, rhinorrhea and sinus pressure. Negative for ear discharge, ear  pain, mouth sores and sore throat.   Eyes: Negative for visual disturbance.  Respiratory: Positive for cough, chest tightness, shortness of breath and wheezing. Negative for stridor.   Cardiovascular: Positive for chest pain ( only with cough). Negative for palpitations and leg swelling.  Gastrointestinal: Negative for nausea, vomiting, abdominal pain and diarrhea.  Genitourinary: Negative for dysuria, urgency, frequency and hematuria.  Musculoskeletal: Negative for myalgias, back pain, arthralgias and neck stiffness.  Skin: Negative for rash.  Neurological: Negative for syncope, numbness and headaches.  Hematological: Negative for adenopathy.  Psychiatric/Behavioral: The patient is not nervous/anxious.   All other systems reviewed and are negative.     Allergies  Review of patient's allergies indicates no known allergies.  Home Medications   Prior to Admission medications   Medication Sig Start Date End Date Taking? Authorizing Provider  aspirin EC 81 MG tablet Take 81 mg by mouth daily.   Yes Historical Provider, MD  naproxen sodium (ANAPROX) 220 MG tablet Take 220 mg by mouth 2 (two) times daily with a meal. For foot pain   Yes Historical Provider, MD  albuterol (PROVENTIL HFA;VENTOLIN HFA) 108 (90 BASE) MCG/ACT inhaler Inhale 2 puffs into the lungs every 4 (four) hours as needed for wheezing or shortness of breath. 01/29/15   Stacey Maura, PA-C  azithromycin (ZITHROMAX Z-PAK) 250 MG tablet 2 po day one, then 1 daily x 4 days Patient not taking: Reported on 01/29/2015 11/22/14  Mancel Bale, MD  cetirizine (ZYRTEC ALLERGY) 10 MG tablet Take 1 tablet (10 mg total) by mouth daily. 01/29/15   Jabier Deese, PA-C  predniSONE (DELTASONE) 10 MG tablet Take 4 tablets (40 mg total) by mouth daily. 01/29/15   Nalaya Wojdyla, PA-C   BP 151/91 mmHg  Pulse 97  Temp(Src) 98 F (36.7 C) (Oral)  Resp 20  SpO2 97% Physical Exam  Constitutional: He is oriented to person, place,  and time. He appears well-developed and well-nourished. No distress.  HENT:  Head: Normocephalic and atraumatic.  Right Ear: Tympanic membrane, external ear and ear canal normal.  Left Ear: Tympanic membrane, external ear and ear canal normal.  Nose: Mucosal edema and rhinorrhea present. No epistaxis. Right sinus exhibits no maxillary sinus tenderness and no frontal sinus tenderness. Left sinus exhibits no maxillary sinus tenderness and no frontal sinus tenderness.  Mouth/Throat: Uvula is midline, oropharynx is clear and moist and mucous membranes are normal. Mucous membranes are not pale and not cyanotic. No oropharyngeal exudate, posterior oropharyngeal edema, posterior oropharyngeal erythema or tonsillar abscesses.  Eyes: Conjunctivae are normal. Pupils are equal, round, and reactive to light.  Neck: Normal range of motion and full passive range of motion without pain.  Cardiovascular: Normal rate and intact distal pulses.   Pulmonary/Chest: Accessory muscle usage present. No stridor. Tachypnea noted. He has decreased breath sounds. He has wheezes. He has no rhonchi. He has no rales.  Significantly diminished breath sounds throughout with audible wheezing (both inspiratory and expiratory) throughout  Abdominal: Soft. Bowel sounds are normal. He exhibits no distension. There is no tenderness.  Musculoskeletal: Normal range of motion. He exhibits no edema.  Lymphadenopathy:    He has no cervical adenopathy.  Neurological: He is alert and oriented to person, place, and time.  Skin: Skin is warm and dry. No rash noted. He is not diaphoretic. No erythema.  Numerous well-healed scars to the chest and abdomen  Psychiatric: He has a normal mood and affect.  Nursing note and vitals reviewed.   ED Course  Procedures (including critical care time) Labs Review Labs Reviewed - No data to display  Imaging Review Dg Chest 2 View  01/29/2015   CLINICAL DATA:  Mid chest pain since last night with  difficulty taking deep inspiration. Smoker. Initial encounter.  EXAM: CHEST  2 VIEW  COMPARISON:  11/22/2014 and 11/13/2014 radiographs.  FINDINGS: The heart size and mediastinal contours are stable. There is central airway thickening consistent with chronic bronchitis, similar to prior studies. No hyperinflation, confluent airspace opacity, edema or significant pleural effusion identified. The bones appear unchanged.  IMPRESSION: Stable examination with findings of chronic bronchitis. No acute findings identified.   Electronically Signed   By: Carey Bullocks M.D.   On: 01/29/2015 18:58     EKG Interpretation None      MDM   Final diagnoses:  Wheezing  Bronchitis with bronchospasm   Sean Thompson presents with URI symptoms and wheezing. Patient without a diagnosis of asthma however question reactive airway disease. Will give albuterol, Atrovent, prednisone and obtain chest x-ray.  7:27 PM Patient ambulated in ED with O2 saturations maintained >90, no current signs of respiratory distress. Lung exam improved after nebulizer treatment - now with clear and equal breath sounds. Prednisone given in the ED and pt will be dc with 5 day burst. Pt states they are breathing at baseline. Pt has been instructed to continue using prescribed medications and to speak with PCP about today's exacerbation.  Also  recommend pulmonology follow-up for pulmonary testing with a pulmonology. Also spent significant amount of time talking about smoking cessation hasn't believe this is contributing to his persistent bronchospasms.  BP 151/91 mmHg  Pulse 97  Temp(Src) 98 F (36.7 C) (Oral)  Resp 20  SpO2 97%   Dierdre Forth, PA-C 01/29/15 1610  Doug Sou, MD 01/30/15 [phone]

## 2015-01-29 NOTE — ED Notes (Addendum)
Pt c/o SOB since yesterday. Pt sts he has had a head cold "for awhile now." Pt was dx with bronchitis about a month ago and thinks he has bronchitis again. Pt wheezing in triage. Pt c/o chest pain only when taking deep breath or coughing. Pt slightly tender to palpation. A&Ox4 and ambulatory. Pt has inhaler at home but ran out a few days ago.

## 2016-02-24 ENCOUNTER — Emergency Department (HOSPITAL_COMMUNITY): Payer: BLUE CROSS/BLUE SHIELD

## 2016-02-24 ENCOUNTER — Emergency Department (HOSPITAL_COMMUNITY)
Admission: EM | Admit: 2016-02-24 | Discharge: 2016-02-24 | Disposition: A | Discharge status: Home / Self Care | Payer: BLUE CROSS/BLUE SHIELD | Attending: Emergency Medicine | Admitting: Emergency Medicine

## 2016-02-24 ENCOUNTER — Encounter (HOSPITAL_COMMUNITY): Payer: Self-pay | Admitting: Emergency Medicine

## 2016-02-24 DIAGNOSIS — Z7982 Long term (current) use of aspirin: Secondary | ICD-10-CM | POA: Insufficient documentation

## 2016-02-24 DIAGNOSIS — J9801 Acute bronchospasm: Secondary | ICD-10-CM

## 2016-02-24 DIAGNOSIS — F17210 Nicotine dependence, cigarettes, uncomplicated: Secondary | ICD-10-CM | POA: Insufficient documentation

## 2016-02-24 DIAGNOSIS — Z8673 Personal history of transient ischemic attack (TIA), and cerebral infarction without residual deficits: Secondary | ICD-10-CM | POA: Insufficient documentation

## 2016-02-24 DIAGNOSIS — J40 Bronchitis, not specified as acute or chronic: Secondary | ICD-10-CM | POA: Insufficient documentation

## 2016-02-24 LAB — CBC WITH DIFFERENTIAL/PLATELET
Basophils Absolute: 0.1 10*3/uL (ref 0.0–0.1)
Basophils Relative: 0 %
EOS ABS: 0.6 10*3/uL (ref 0.0–0.7)
Eosinophils Relative: 4 %
HCT: 44.1 % (ref 39.0–52.0)
Hemoglobin: 15.3 g/dL (ref 13.0–17.0)
Lymphocytes Relative: 23 %
Lymphs Abs: 3.3 10*3/uL (ref 0.7–4.0)
MCH: 33 pg (ref 26.0–34.0)
MCHC: 34.7 g/dL (ref 30.0–36.0)
MCV: 95.2 fL (ref 78.0–100.0)
MONO ABS: 1.4 10*3/uL — AB (ref 0.1–1.0)
Monocytes Relative: 9 %
NEUTROS PCT: 64 %
Neutro Abs: 9.1 10*3/uL — ABNORMAL HIGH (ref 1.7–7.7)
Platelets: 251 10*3/uL (ref 150–400)
RBC: 4.63 MIL/uL (ref 4.22–5.81)
RDW: 12.7 % (ref 11.5–15.5)
WBC: 14.5 10*3/uL — ABNORMAL HIGH (ref 4.0–10.5)

## 2016-02-24 LAB — I-STAT CHEM 8, ED
BUN: 27 mg/dL — ABNORMAL HIGH (ref 6–20)
Calcium, Ion: 1.13 mmol/L (ref 1.12–1.23)
Chloride: 104 mmol/L (ref 101–111)
Creatinine, Ser: 0.9 mg/dL (ref 0.61–1.24)
Glucose, Bld: 101 mg/dL — ABNORMAL HIGH (ref 65–99)
HEMATOCRIT: 46 % (ref 39.0–52.0)
Hemoglobin: 15.6 g/dL (ref 13.0–17.0)
Potassium: 4.1 mmol/L (ref 3.5–5.1)
SODIUM: 142 mmol/L (ref 135–145)
TCO2: 27 mmol/L (ref 0–100)

## 2016-02-24 LAB — BRAIN NATRIURETIC PEPTIDE: B NATRIURETIC PEPTIDE 5: 14.4 pg/mL (ref 0.0–100.0)

## 2016-02-24 MED ORDER — ALBUTEROL SULFATE (2.5 MG/3ML) 0.083% IN NEBU
2.5000 mg | INHALATION_SOLUTION | Freq: Once | RESPIRATORY_TRACT | Status: AC
  Administered 2016-02-24: 2.5 mg via RESPIRATORY_TRACT
  Filled 2016-02-24: qty 3

## 2016-02-24 MED ORDER — ALBUTEROL SULFATE HFA 108 (90 BASE) MCG/ACT IN AERS
2.0000 | INHALATION_SPRAY | RESPIRATORY_TRACT | Status: DC | PRN
  Administered 2016-02-24: 2 via RESPIRATORY_TRACT
  Filled 2016-02-24: qty 6.7

## 2016-02-24 MED ORDER — AZITHROMYCIN 250 MG PO TABS: ORAL_TABLET | ORAL | Status: DC

## 2016-02-24 MED ORDER — ALBUTEROL SULFATE (2.5 MG/3ML) 0.083% IN NEBU
5.0000 mg | INHALATION_SOLUTION | Freq: Once | RESPIRATORY_TRACT | Status: AC
  Administered 2016-02-24: 5 mg via RESPIRATORY_TRACT
  Filled 2016-02-24: qty 6

## 2016-02-24 MED ORDER — METHYLPREDNISOLONE SODIUM SUCC 125 MG IJ SOLR
125.0000 mg | Freq: Once | INTRAMUSCULAR | Status: DC
  Filled 2016-02-24: qty 2

## 2016-02-24 MED ORDER — IPRATROPIUM-ALBUTEROL 0.5-2.5 (3) MG/3ML IN SOLN
3.0000 mL | Freq: Once | RESPIRATORY_TRACT | Status: AC
  Administered 2016-02-24: 3 mL via RESPIRATORY_TRACT
  Filled 2016-02-24: qty 3

## 2016-02-24 MED ORDER — PREDNISONE 10 MG PO TABS: 20.0000 mg | ORAL_TABLET | Freq: Every day | ORAL | Status: DC

## 2016-02-24 MED ORDER — METHYLPREDNISOLONE SODIUM SUCC 125 MG IJ SOLR
125.0000 mg | Freq: Once | INTRAMUSCULAR | Status: AC
  Administered 2016-02-24: 125 mg via INTRAMUSCULAR

## 2016-02-24 MED ORDER — ALBUTEROL SULFATE (2.5 MG/3ML) 0.083% IN NEBU
2.5000 mg | INHALATION_SOLUTION | Freq: Once | RESPIRATORY_TRACT | Status: AC
Start: 2016-02-24 — End: 2016-02-24
  Administered 2016-02-24: 2.5 mg via RESPIRATORY_TRACT
  Filled 2016-02-24: qty 3

## 2016-02-24 NOTE — Discharge Instructions (Signed)
Follow up next week for recheck °

## 2016-02-24 NOTE — ED Notes (Addendum)
Pt presents with SOB and wheezing. States he was dx with bronchitis approx. 1 month back and treated but his symptoms came back after finishing meds. Alert and oriented. Audible inspiratory and expiratory wheezing.

## 2016-02-24 NOTE — ED Provider Notes (Signed)
CSN: [id number]     Arrival date & time 02/24/16  4034 History   First MD Initiated Contact with Patient 02/24/16 2008     Chief Complaint  Patient presents with  . Shortness of Breath     (Consider location/radiation/quality/duration/timing/severity/associated sxs/prior Treatment) Patient is a 40 y.o. male presenting with shortness of breath. The history is provided by the patient (Patient complains of cough shortness breath and wheezing.).  Shortness of Breath Severity:  Moderate Onset quality:  Sudden Timing:  Constant Progression:  Worsening Chronicity:  Recurrent Context: activity   Associated symptoms: wheezing   Associated symptoms: no abdominal pain, no chest pain, no cough, no headaches and no rash     Past Medical History  Diagnosis Date  . Stroke (HCC)   . PNA (pneumonia)     pneumonia x1  . 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 No pertinent past surgical history. Family History  Problem Relation Age of Onset  . Hypertension Father    Social History  Substance Use Topics  . Smoking status: Current Every Day Smoker -- 0.50 packs/day    Types: Cigarettes  . Smokeless tobacco: Never Used  . Alcohol Use: Yes     Comment: 40 ounce daily    Review of Systems  Constitutional: Negative for appetite change and fatigue.  HENT: Negative for congestion, ear discharge and sinus pressure.   Eyes: Negative for discharge.  Respiratory: Positive for shortness of breath and wheezing. Negative for cough.   Cardiovascular: Negative for chest pain.  Gastrointestinal: Negative for abdominal pain and diarrhea.  Genitourinary: Negative for frequency and hematuria.  Musculoskeletal: Negative for back pain.  Skin: Negative for rash.  Neurological: Negative for seizures and headaches.  Psychiatric/Behavioral: Negative for hallucinations.      Allergies  Review of patient's allergies indicates no known allergies.  Home Medications    Prior to Admission medications   Medication Sig Start Date End Date Taking? Authorizing Provider  albuterol (PROVENTIL HFA;VENTOLIN HFA) 108 (90 BASE) MCG/ACT inhaler Inhale 2 puffs into the lungs every 4 (four) hours as needed for wheezing or shortness of breath. 01/29/15  Yes Hannah Muthersbaugh, PA-C  aspirin EC 81 MG tablet Take 81 mg by mouth daily.   Yes Historical Provider, MD  azithromycin (ZITHROMAX Z-PAK) 250 MG tablet 2 po day one, then 1 daily x 4 days 02/24/16   Bethann Berkshire, MD  cetirizine (ZYRTEC ALLERGY) 10 MG tablet Take 1 tablet (10 mg total) by mouth daily. Patient not taking: Reported on 02/24/2016 01/29/15   Dahlia Client Muthersbaugh, PA-C  predniSONE (DELTASONE) 10 MG tablet Take 2 tablets (20 mg total) by mouth daily. 02/24/16   Bethann Berkshire, MD   BP 145/99 mmHg  Pulse 110  Temp(Src) 99.6 F (37.6 C) (Oral)  Resp 12  SpO2 98% Physical Exam  Constitutional: He is oriented to person, place, and time. He appears well-developed.  HENT:  Head: Normocephalic.  Eyes: Conjunctivae and EOM are normal. No scleral icterus.  Neck: Neck supple. No thyromegaly present.  Cardiovascular: Normal rate and regular rhythm.  Exam reveals no gallop and no friction rub.   No murmur heard. Pulmonary/Chest: No stridor. He has wheezes. He has no rales. He exhibits no tenderness.  Abdominal: He exhibits no distension. There is no tenderness. There is no rebound.  Musculoskeletal: Normal range of motion. He exhibits no edema.  Lymphadenopathy:    He has no cervical adenopathy.  Neurological: He  is oriented to person, place, and time. He exhibits normal muscle tone. Coordination normal.  Skin: No rash noted. No erythema.  Psychiatric: He has a normal mood and affect. His behavior is normal.    ED Course  Procedures (including critical care time) Labs Review Labs Reviewed  CBC WITH DIFFERENTIAL/PLATELET - Abnormal; Notable for the following:    WBC 14.5 (*)    Neutro Abs 9.1 (*)     Monocytes Absolute 1.4 (*)    All other components within normal limits  I-STAT CHEM 8, ED - Abnormal; Notable for the following:    BUN 27 (*)    Glucose, Bld 101 (*)    All other components within normal limits  BRAIN NATRIURETIC PEPTIDE    Imaging Review Dg Chest 2 View  02/24/2016  CLINICAL DATA:  Shortness of breath with wheezing. Diagnosed with bronchitis 1 month ago. EXAM: CHEST  2 VIEW COMPARISON:  01/29/2015 and 11/22/2014 radiographs. FINDINGS: The heart size and mediastinal contours are stable. There is stable chronic central airway thickening. No hyperinflation, airspace disease, edema or pleural effusion. The bones appear unchanged. Telemetry leads overlie the chest. IMPRESSION: Stable examination with findings of chronic bronchitis. No acute cardiopulmonary process. Electronically Signed   By: Carey BullocksWilliam  Veazey M.D.   On: 02/24/2016 20:16   I have personally reviewed and evaluated these images and lab results as part of my medical decision-making.   EKG Interpretation   Date/Time:  Thursday Feb 24 2016 19:48:59 EDT Ventricular Rate:  109 PR Interval:  135 QRS Duration: 72 QT Interval:  311 QTC Calculation: 419 R Axis:   79 Text Interpretation:  Sinus tachycardia Right atrial enlargement Confirmed  by Yanuel Tagg  MD, Videl Nobrega [phone]([id number]) on 02/24/2016 9:42:20 PM      MDM   Final diagnoses:  Bronchitis  Bronchospasm    Patient with bronchitis and bronchospasm. Patient improved with 2 neb treatments along with steroids. He will be sent home on prednisone and Z-Pak and on albuterol inhaler. He will follow-up with primary care doctor next week    Bethann BerkshireJoseph Haivyn Oravec, MD 02/24/16 2316

## 2016-02-24 NOTE — ED Notes (Signed)
Respiratory contacted for breathing treatment administration.

## 2016-02-24 NOTE — ED Notes (Signed)
Attempt to initiate IV without success.

## 2017-02-07 ENCOUNTER — Emergency Department (HOSPITAL_COMMUNITY)
Admission: EM | Admit: 2017-02-07 | Discharge: 2017-02-07 | Disposition: A | Discharge status: Home / Self Care | Payer: BLUE CROSS/BLUE SHIELD | Attending: Emergency Medicine | Admitting: Emergency Medicine

## 2017-02-07 ENCOUNTER — Emergency Department (HOSPITAL_COMMUNITY): Payer: BLUE CROSS/BLUE SHIELD

## 2017-02-07 ENCOUNTER — Encounter (HOSPITAL_COMMUNITY): Payer: Self-pay | Admitting: Obstetrics and Gynecology

## 2017-02-07 DIAGNOSIS — Z8673 Personal history of transient ischemic attack (TIA), and cerebral infarction without residual deficits: Secondary | ICD-10-CM | POA: Insufficient documentation

## 2017-02-07 DIAGNOSIS — J4541 Moderate persistent asthma with (acute) exacerbation: Secondary | ICD-10-CM | POA: Insufficient documentation

## 2017-02-07 DIAGNOSIS — Z87891 Personal history of nicotine dependence: Secondary | ICD-10-CM | POA: Insufficient documentation

## 2017-02-07 DIAGNOSIS — Z79899 Other long term (current) drug therapy: Secondary | ICD-10-CM | POA: Diagnosis not present

## 2017-02-07 DIAGNOSIS — R0602 Shortness of breath: Secondary | ICD-10-CM | POA: Diagnosis present

## 2017-02-07 DIAGNOSIS — J45901 Unspecified asthma with (acute) exacerbation: Secondary | ICD-10-CM

## 2017-02-07 LAB — CBC WITH DIFFERENTIAL/PLATELET
Basophils Absolute: 0 10*3/uL (ref 0.0–0.1)
Basophils Relative: 0 %
EOS PCT: 7 %
Eosinophils Absolute: 0.9 10*3/uL — ABNORMAL HIGH (ref 0.0–0.7)
HCT: 44.8 % (ref 39.0–52.0)
Hemoglobin: 15.4 g/dL (ref 13.0–17.0)
LYMPHS ABS: 2.7 10*3/uL (ref 0.7–4.0)
Lymphocytes Relative: 20 %
MCH: 32 pg (ref 26.0–34.0)
MCHC: 34.4 g/dL (ref 30.0–36.0)
MCV: 93.1 fL (ref 78.0–100.0)
MONO ABS: 0.9 10*3/uL (ref 0.1–1.0)
Monocytes Relative: 7 %
NEUTROS ABS: 8.8 10*3/uL — AB (ref 1.7–7.7)
Neutrophils Relative %: 66 %
PLATELETS: 251 10*3/uL (ref 150–400)
RBC: 4.81 MIL/uL (ref 4.22–5.81)
RDW: 12.6 % (ref 11.5–15.5)
WBC: 13.3 10*3/uL — ABNORMAL HIGH (ref 4.0–10.5)

## 2017-02-07 LAB — URINALYSIS, ROUTINE W REFLEX MICROSCOPIC
Bilirubin Urine: NEGATIVE
GLUCOSE, UA: NEGATIVE mg/dL
HGB URINE DIPSTICK: NEGATIVE
KETONES UR: NEGATIVE mg/dL
LEUKOCYTES UA: NEGATIVE
Nitrite: NEGATIVE
PH: 5 (ref 5.0–8.0)
Protein, ur: NEGATIVE mg/dL
Specific Gravity, Urine: 1.02 (ref 1.005–1.030)

## 2017-02-07 LAB — BASIC METABOLIC PANEL
Anion gap: 8 (ref 5–15)
BUN: 17 mg/dL (ref 6–20)
CO2: 25 mmol/L (ref 22–32)
Calcium: 9.2 mg/dL (ref 8.9–10.3)
Chloride: 106 mmol/L (ref 101–111)
Creatinine, Ser: 0.84 mg/dL (ref 0.61–1.24)
GFR calc Af Amer: >60 mL/min (ref >60)
GLUCOSE: 102 mg/dL — AB (ref 65–99)
POTASSIUM: 3.9 mmol/L (ref 3.5–5.1)
Sodium: 139 mmol/L (ref 135–145)

## 2017-02-07 LAB — RAPID URINE DRUG SCREEN, HOSP PERFORMED
Amphetamines: NOT DETECTED
BENZODIAZEPINES: NOT DETECTED
Barbiturates: NOT DETECTED
Cocaine: NOT DETECTED
OPIATES: NOT DETECTED
TETRAHYDROCANNABINOL: POSITIVE — AB

## 2017-02-07 LAB — BRAIN NATRIURETIC PEPTIDE: B Natriuretic Peptide: 22.2 pg/mL (ref 0.0–100.0)

## 2017-02-07 LAB — I-STAT TROPONIN, ED: Troponin i, poc: 0 ng/mL (ref 0.00–0.08)

## 2017-02-07 MED ORDER — PREDNISONE 20 MG PO TABS: 20.0000 mg | ORAL_TABLET | Freq: Two times a day (BID) | ORAL | 0 refills | Status: AC

## 2017-02-07 MED ORDER — IPRATROPIUM-ALBUTEROL 0.5-2.5 (3) MG/3ML IN SOLN
3.0000 mL | Freq: Once | RESPIRATORY_TRACT | Status: AC
  Administered 2017-02-07: 3 mL via RESPIRATORY_TRACT
  Filled 2017-02-07: qty 3

## 2017-02-07 MED ORDER — PREDNISONE 20 MG PO TABS
60.0000 mg | ORAL_TABLET | Freq: Once | ORAL | Status: AC
  Administered 2017-02-07: 60 mg via ORAL
  Filled 2017-02-07: qty 3

## 2017-02-07 MED ORDER — ALBUTEROL (5 MG/ML) CONTINUOUS INHALATION SOLN
10.0000 mg/h | INHALATION_SOLUTION | Freq: Once | RESPIRATORY_TRACT | Status: AC
  Administered 2017-02-07: 10 mg/h via RESPIRATORY_TRACT
  Filled 2017-02-07: qty 20

## 2017-02-07 MED ORDER — ALBUTEROL SULFATE HFA 108 (90 BASE) MCG/ACT IN AERS
2.0000 | INHALATION_SPRAY | RESPIRATORY_TRACT | Status: DC | PRN
  Administered 2017-02-07: 2 via RESPIRATORY_TRACT
  Filled 2017-02-07: qty 6.7

## 2017-02-07 NOTE — ED Notes (Signed)
Ambulatory to restroom, independent w/steady gait.  Speaks in full sentences.

## 2017-02-07 NOTE — Discharge Instructions (Signed)
Use the inhaler 2 puffs every 3 or 4 hours as needed for cough or trouble breathing.  Start the prednisone prescription tomorrow morning.  Use the resource guide, or your insurance information to help you find a primary care doctor to see for ongoing medical care.

## 2017-02-07 NOTE — ED Notes (Signed)
Pt states "I feel so much betrter and I'm ready to go home".  Lung sounds clear bilat.  MD aware.

## 2017-02-07 NOTE — ED Triage Notes (Signed)
Pt presents to the ED with difficulty breathing. Pt states he has had difficulty breathing the past two days. Pt states he has used his inhaler approximately 20 times in the past day. He says his sleep was interrupted by not being able to breathe. Pt states he "is wheezing, coughing, and choking" upon any exertion.

## 2017-02-07 NOTE — ED Provider Notes (Signed)
[facility]-EMERGENCY DEPT Provider Note   CSN: [id number] Arrival date & time: 02/07/17  1610     History   Chief Complaint Chief Complaint  Patient presents with  . Shortness of Breath    HPI Sean Thompson is a 41 y.o. male.  He presents for evaluation of cough, with shortness of breath, and feeling like his asthma is acting up, for 2 days.  He is using an albuterol inhaler without relief.  He has used at least 20 times today.  He stopped smoking 3 months ago.  He states that he has a history of stroke and heart attack.  He is not currently taking any medications.  He does not have a primary care doctor or see a cardiologist at this time.  He denies nausea, vomiting, weakness or dizziness.  There are no other known modifying factors.  HPI  Past Medical History:  Diagnosis Date  . PNA (pneumonia)    pneumonia x1  . Stroke (HCC)   . 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   History reviewed. No pertinent surgical history.     Home Medications    Prior to Admission medications   Medication Sig Start Date End Date Taking? Authorizing Provider  naproxen sodium (ANAPROX) 220 MG tablet Take 4,400 mg by mouth 2 (two) times daily with a meal.   Yes [provider]  albuterol (PROVENTIL HFA;VENTOLIN HFA) 108 (90 BASE) MCG/ACT inhaler Inhale 2 puffs into the lungs every 4 (four) hours as needed for wheezing or shortness of breath. Patient not taking: Reported on 02/07/2017 01/29/15   Muthersbaugh, Dahlia Client, PA-C  predniSONE (DELTASONE) 20 MG tablet Take 1 tablet (20 mg total) by mouth 2 (two) times daily. 02/07/17   Mancel Bale, MD    Family History Family History  Problem Relation Age of Onset  . Hypertension Father     Social History Social History  Substance Use Topics  . Smoking status: Former Smoker    Packs/day: 0.50    Types: Cigarettes    Quit date: 11/10/2016  . Smokeless  tobacco: Never Used  . Alcohol use Yes     Comment: 40 ounce daily     Allergies   Shellfish allergy   Review of Systems Review of Systems  All other systems reviewed and are negative.    Physical Exam Updated Vital Signs BP 133/89   Pulse 89   Temp 99.1 F (37.3 C) (Oral)   Resp (!) 26   Ht 6' (1.829 m)   Wt 210 lb (95.3 kg)   SpO2 100%   BMI 28.48 kg/m   Physical Exam  Constitutional: He is oriented to person, place, and time. He appears well-developed and well-nourished. No distress.  HENT:  Head: Normocephalic and atraumatic.  Right Ear: External ear normal.  Left Ear: External ear normal.  Eyes: Conjunctivae and EOM are normal. Pupils are equal, round, and reactive to light.  Neck: Normal range of motion and phonation normal. Neck supple.  Cardiovascular: Normal rate, regular rhythm and normal heart sounds.   Pulmonary/Chest: Effort normal. No respiratory distress. He exhibits no bony tenderness.  Decreased air movement bilateral lung fields, upper and lower, with generalized wheezing.  There is no increased work of breathing.  Abdominal: Soft. There is no tenderness.  Musculoskeletal: Normal range of motion. He exhibits no edema or tenderness.  Neurological: He is alert and oriented to  person, place, and time. No cranial nerve deficit or sensory deficit. He exhibits normal muscle tone. Coordination normal.  Skin: Skin is warm, dry and intact.  Psychiatric: He has a normal mood and affect. His behavior is normal. Judgment and thought content normal.  Nursing note and vitals reviewed.    ED Treatments / Results  Labs (all labs ordered are listed, but only abnormal results are displayed) Labs Reviewed  CBC WITH DIFFERENTIAL/PLATELET - Abnormal; Notable for the following:       Result Value   WBC 13.3 (*)    Neutro Abs 8.8 (*)    Eosinophils Absolute 0.9 (*)    All other components within normal limits  BASIC METABOLIC PANEL - Abnormal; Notable for the  following:    Glucose, Bld 102 (*)    All other components within normal limits  RAPID URINE DRUG SCREEN, HOSP PERFORMED - Abnormal; Notable for the following:    Tetrahydrocannabinol POSITIVE (*)    All other components within normal limits  BRAIN NATRIURETIC PEPTIDE  URINALYSIS, ROUTINE W REFLEX MICROSCOPIC  I-STAT TROPOININ, ED    EKG  EKG Interpretation  Date/Time:  Wednesday Feb 07 2017 16:39:09 EDT Ventricular Rate:  120 PR Interval:    QRS Duration: 86 QT Interval:  307 QTC Calculation: 434 R Axis:   81 Text Interpretation:  Sinus tachycardia Consider right atrial enlargement Artifact in lead(s) I II III aVL V4 slt Confirmed by Mancel BaleWentz, Reynold Mantell [phone]([id number]) on 02/07/2017 6:35:57 PM       Radiology Dg Chest 2 View  Result Date: 02/07/2017 CLINICAL DATA:  Dyspnea EXAM: CHEST  2 VIEW COMPARISON:  02/24/2016 chest radiograph. FINDINGS: Stable cardiomediastinal silhouette with normal heart size. No pneumothorax. No pleural effusion. Lungs appear clear, with no acute consolidative airspace disease and no pulmonary edema. IMPRESSION: No active cardiopulmonary disease. Electronically Signed   By: Delbert PhenixJason A Poff M.D.   On: 02/07/2017 19:45    Procedures Procedures (including critical care time)  Medications Ordered in ED Medications  albuterol (PROVENTIL HFA;VENTOLIN HFA) 108 (90 Base) MCG/ACT inhaler 2 puff (not administered)  ipratropium-albuterol (DUONEB) 0.5-2.5 (3) MG/3ML nebulizer solution 3 mL (3 mLs Nebulization Given 02/07/17 1700)  predniSONE (DELTASONE) tablet 60 mg (60 mg Oral Given 02/07/17 2011)  albuterol (PROVENTIL,VENTOLIN) solution continuous neb (10 mg/hr Nebulization Given 02/07/17 1934)     Initial Impression / Assessment and Plan / ED Course  I have reviewed the triage vital signs and the nursing notes.  Pertinent labs & imaging results that were available during my care of the patient were reviewed by me and considered in my medical decision making (see chart for  details).     Medications  albuterol (PROVENTIL HFA;VENTOLIN HFA) 108 (90 Base) MCG/ACT inhaler 2 puff (not administered)  ipratropium-albuterol (DUONEB) 0.5-2.5 (3) MG/3ML nebulizer solution 3 mL (3 mLs Nebulization Given 02/07/17 1700)  predniSONE (DELTASONE) tablet 60 mg (60 mg Oral Given 02/07/17 2011)  albuterol (PROVENTIL,VENTOLIN) solution continuous neb (10 mg/hr Nebulization Given 02/07/17 1934)    Patient Vitals for the past 24 hrs:  BP Temp Temp src Pulse Resp SpO2 Height Weight  02/07/17 2015 - - - - - 100 % - -  02/07/17 2010 133/89 - - - (!) 26 100 % - -  02/07/17 1926 (!) 144/98 - - 89 14 96 % - -  02/07/17 1700 - - - (!) 107 18 98 % - -  02/07/17 1648 - - - - - - 6' (1.829 m) 210 lb (95.3 kg)  02/07/17 1611 (!) 154/128 99.1 F (37.3 C) Oral (!) 122 20 92 % 6' (1.829 m) 210 lb (95.3 kg)    9:20 PM Reevaluation with update and discussion. After initial assessment and treatment, an updated evaluation reveals patient states he feels better at this time.  Findings discussed with patient and all questions answered. Simcha Speir L    Final Clinical Impressions(s) / ED Diagnoses   Final diagnoses:  Moderate asthma with exacerbation, unspecified whether persistent   Evaluation consistent with asthma, without apparent pneumonia, PE or ACS.  Patient improved with treatment.  Initial elevated blood pressure improved after treatment.  Doubt hypertensive urgency.  Nursing Notes Reviewed/ Care Coordinated Applicable Imaging Reviewed Interpretation of Laboratory Data incorporated into ED treatment  The patient appears reasonably screened and/or stabilized for discharge and I doubt any other medical condition or other [facility] requiring further screening, evaluation, or treatment in the ED at this time prior to discharge.  Plan: Home Medications-continue usual, albuterol inhaler to go; Home Treatments-rest; return here if the recommended treatment, does not improve the symptoms;  Recommended follow up-PCP, as needed   New Prescriptions New Prescriptions   PREDNISONE (DELTASONE) 20 MG TABLET    Take 1 tablet (20 mg total) by mouth 2 (two) times daily.     Mancel Bale, MD 02/08/17 [phone]

## 2017-02-07 NOTE — ED Notes (Signed)
Patient transported to X-ray 

## 2018-09-06 IMAGING — CR DG CHEST 2V
2 series · 2 of 2 positions shown · non-contrast
Comparison: 02/24/2016 chest radiograph.

CLINICAL DATA: Dyspnea

EXAM:
CHEST  2 VIEW

[w chest pa]
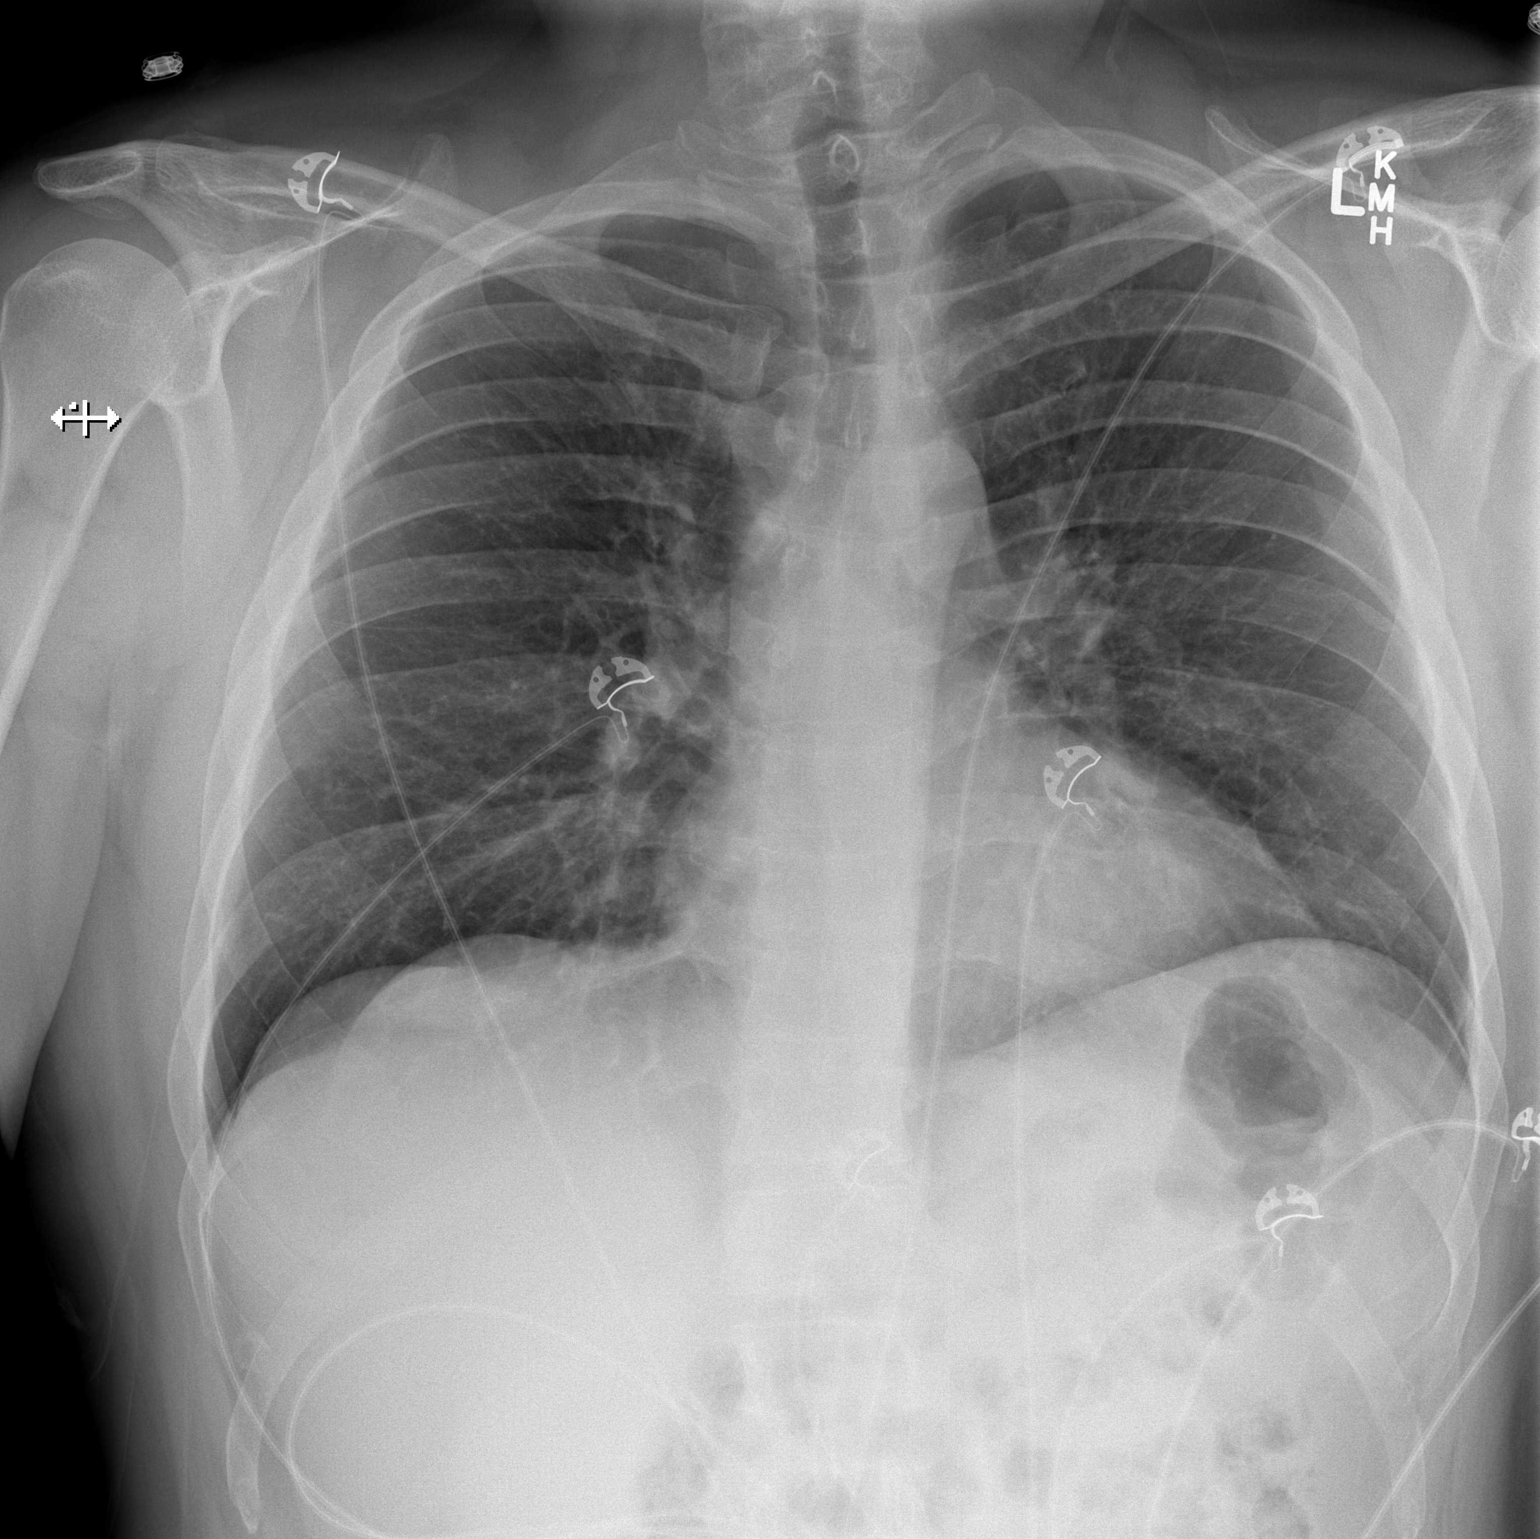

[w chest lat]
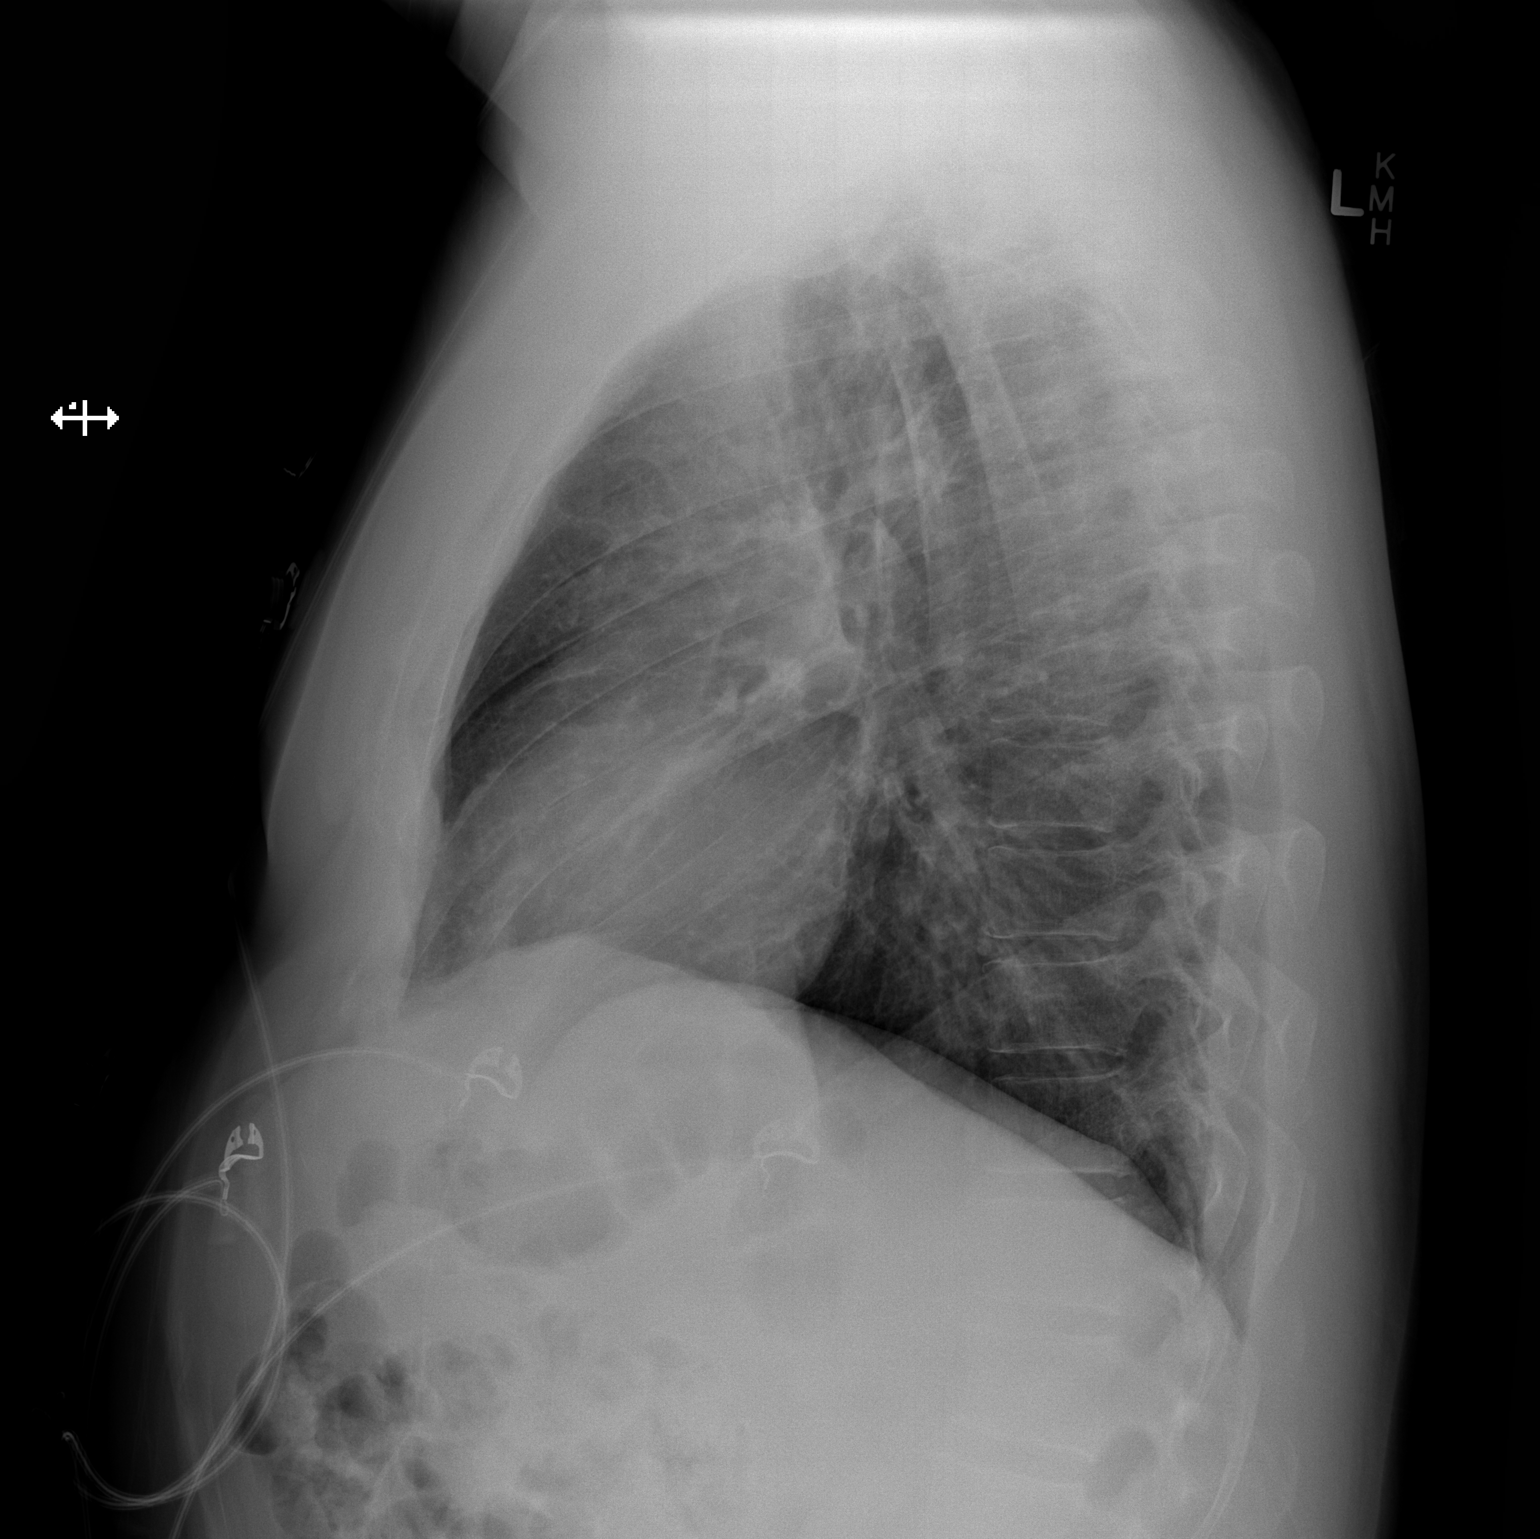

[2 of 2 positions shown; findings below may reference images not displayed]

FINDINGS: Stable cardiomediastinal silhouette with normal heart size. No
pneumothorax. No pleural effusion. Lungs appear clear, with no acute
consolidative airspace disease and no pulmonary edema.
IMPRESSION: No active cardiopulmonary disease.
